# Patient Record
Sex: Male | Born: 1935 | Race: White | Hispanic: No | Marital: Married | Smoking: Former smoker
Health system: Southern US, Community
[De-identification: ages and names within clinical notes are randomized; demographics above are authoritative.]

## PROBLEM LIST (undated history)

## (undated) DIAGNOSIS — I4892 Unspecified atrial flutter: Secondary | ICD-10-CM

## (undated) DIAGNOSIS — I10 Essential (primary) hypertension: Secondary | ICD-10-CM

## (undated) DIAGNOSIS — J449 Chronic obstructive pulmonary disease, unspecified: Secondary | ICD-10-CM

## (undated) HISTORY — PX: KIDNEY STONE SURGERY: SHX686

## (undated) HISTORY — DX: Chronic obstructive pulmonary disease, unspecified: J44.9

## (undated) HISTORY — PX: HERNIA REPAIR: SHX51

## (undated) HISTORY — DX: Essential (primary) hypertension: I10

## (undated) HISTORY — DX: Unspecified atrial flutter: I48.92

---

## 2005-09-29 ENCOUNTER — Ambulatory Visit: Payer: Self-pay | Admitting: Family Medicine

## 2008-09-19 ENCOUNTER — Ambulatory Visit: Payer: Self-pay | Admitting: Family Medicine

## 2008-12-25 ENCOUNTER — Inpatient Hospital Stay: Payer: Self-pay | Admitting: Internal Medicine

## 2009-01-23 ENCOUNTER — Ambulatory Visit: Payer: Self-pay | Admitting: Family Medicine

## 2009-10-02 ENCOUNTER — Ambulatory Visit: Payer: Self-pay | Admitting: Ophthalmology

## 2010-03-31 ENCOUNTER — Ambulatory Visit: Payer: Self-pay | Admitting: Ophthalmology

## 2010-04-07 ENCOUNTER — Ambulatory Visit: Payer: Self-pay | Admitting: Ophthalmology

## 2010-05-15 ENCOUNTER — Ambulatory Visit: Payer: Self-pay | Admitting: Ophthalmology

## 2010-05-21 ENCOUNTER — Ambulatory Visit: Payer: Self-pay | Admitting: Ophthalmology

## 2010-08-04 ENCOUNTER — Emergency Department: Payer: Self-pay | Admitting: Internal Medicine

## 2011-04-03 ENCOUNTER — Inpatient Hospital Stay: Payer: Self-pay | Admitting: Internal Medicine

## 2011-04-14 ENCOUNTER — Observation Stay: Payer: Self-pay | Admitting: Internal Medicine

## 2011-04-14 LAB — COMPREHENSIVE METABOLIC PANEL
Alkaline Phosphatase: 58 U/L (ref 50–136)
Anion Gap: 10 (ref 7–16)
Bilirubin,Total: 0.7 mg/dL (ref 0.2–1.0)
Calcium, Total: 8.6 mg/dL (ref 8.5–10.1)
Chloride: 96 mmol/L — ABNORMAL LOW (ref 98–107)
Co2: 35 mmol/L — ABNORMAL HIGH (ref 21–32)
Creatinine: 0.71 mg/dL (ref 0.60–1.30)
EGFR (African American): >60
EGFR (Non-African Amer.): >60
Glucose: 141 mg/dL — ABNORMAL HIGH (ref 65–99)
Osmolality: 286 (ref 275–301)
SGOT(AST): 19 U/L (ref 15–37)
SGPT (ALT): 21 U/L

## 2011-04-14 LAB — CK TOTAL AND CKMB (NOT AT ARMC)
CK, Total: 34 U/L — ABNORMAL LOW (ref 35–232)
CK-MB: 2.5 ng/mL (ref 0.5–3.6)

## 2011-04-14 LAB — URINALYSIS, COMPLETE
Glucose,UR: NEGATIVE mg/dL (ref 0–75)
Nitrite: NEGATIVE
Ph: 7 (ref 4.5–8.0)
Protein: NEGATIVE
Specific Gravity: 1.004 (ref 1.003–1.030)

## 2011-04-14 LAB — CBC
HGB: 14 g/dL (ref 13.0–18.0)
MCH: 31 pg (ref 26.0–34.0)
MCHC: 32.8 g/dL (ref 32.0–36.0)
MCV: 94 fL (ref 80–100)
RBC: 4.52 10*6/uL (ref 4.40–5.90)
RDW: 14.6 % — ABNORMAL HIGH (ref 11.5–14.5)

## 2011-04-14 LAB — APTT: Activated PTT: 24.9 secs (ref 23.6–35.9)

## 2011-04-14 LAB — TROPONIN I
Troponin-I: 0.03 ng/mL
Troponin-I: 0.04 ng/mL

## 2011-04-15 LAB — CBC WITH DIFFERENTIAL/PLATELET
Basophil %: 0.2 %
Eosinophil %: 0.9 %
HGB: 12.9 g/dL — ABNORMAL LOW (ref 13.0–18.0)
Lymphocyte %: 12.1 %
MCH: 30.9 pg (ref 26.0–34.0)
Monocyte %: 6.2 %
Neutrophil %: 80.6 %
Platelet: 149 10*3/uL — ABNORMAL LOW (ref 150–440)
RBC: 4.17 10*6/uL — ABNORMAL LOW (ref 4.40–5.90)

## 2011-04-15 LAB — BASIC METABOLIC PANEL
Anion Gap: 8 (ref 7–16)
BUN: 21 mg/dL — ABNORMAL HIGH (ref 7–18)
Calcium, Total: 8.4 mg/dL — ABNORMAL LOW (ref 8.5–10.1)
EGFR (African American): >60
Glucose: 113 mg/dL — ABNORMAL HIGH (ref 65–99)
Potassium: 4 mmol/L (ref 3.5–5.1)

## 2011-04-15 LAB — CK-MB: CK-MB: 1.7 ng/mL (ref 0.5–3.6)

## 2012-01-10 ENCOUNTER — Ambulatory Visit: Payer: Self-pay | Admitting: Family Medicine

## 2012-03-13 ENCOUNTER — Institutional Professional Consult (permissible substitution): Payer: Medicare Other | Admitting: Pulmonary Disease

## 2012-03-14 ENCOUNTER — Ambulatory Visit (INDEPENDENT_AMBULATORY_CARE_PROVIDER_SITE_OTHER): Payer: Medicare Other | Admitting: Pulmonary Disease

## 2012-03-14 ENCOUNTER — Encounter: Payer: Self-pay | Admitting: Pulmonary Disease

## 2012-03-14 VITALS — BP 110/58 | HR 72 | Temp 97.6°F | Ht 68.0 in | Wt 125.0 lb

## 2012-03-14 DIAGNOSIS — J449 Chronic obstructive pulmonary disease, unspecified: Secondary | ICD-10-CM | POA: Insufficient documentation

## 2012-03-14 NOTE — Patient Instructions (Signed)
Keep taking your Spiriva and Advair as you are doing Exercise regularly, if you are able to make it to pulmonary rehab, let us know Keep using your oxygen as ordered We will see you back in 4 months or sooner if needed

## 2012-03-14 NOTE — Assessment & Plan Note (Signed)
COPD: GOLD Stage cannot quantify because spirometry not reproducible or acceptable; clear obstruction on flow volume loop noted  Combined recommendations from the [employer]	[place], [employer], Designer, television/film set, European Respiratory Society (Qaseem A et al, Ann Intern Med. 2011;155(3):179) recommends tobacco cessation, pulmonary rehab (for symptomatic patients with an FEV1 < 50% predicted), supplemental oxygen (for patients with SaO2 <88% or paO2 <55), and appropriate bronchodilator therapy.  In regards to long acting bronchodilators, they recommend monotherapy (FEV1 60-80% with symptoms weak evidence, FEV1 with symptoms <60% strong evidence), or combination therapy (FEV1 <60% with symptoms, strong recommendation, moderate evidence).  One should also provide patients with annual immunizations and consider therapy for prevention of COPD exacerbations (ie. roflumilast or azithromycin) when appopriate.  -O2 therapy: 3 L O2 continuously -Immunizations: UTD 2013 -Tobacco use: Quit 2005 -Exercise: Encouraged to exercise around house more, encouraged him to consider pulmonary rehab but he does not have transportation for it -Bronchodilator therapy: continue spiriva and advair -Exacerbation prevention: spiriva

## 2012-03-14 NOTE — Progress Notes (Signed)
Subjective:    Patient ID: Jeffery Lambert, male    DOB: 1921-10-06, 78 y.o.   MRN: [id number]  HPI  This is a very pleasant 78 year old male a COPD who comes to our clinic today to establish care for the same. His primary care physician is Dr. Venora Maples here in [place]. He smoked 4-6 cigarettes a day for 70 years and quit in 2010.  He has noted these have COPD for at least 2-3 years and has been. With Spiriva and Advair for at least that duration of time. He has previously seen by Dr. Meredeth Ide.  He currently is doing well. He lives a fairly sedentary life and does not exert himself on a regular basis. He states that at baseline he walks around the house some and never really experiences shortness of breath. His brother accompanies him today and says that he doesn't think that he can walk through grocery store without stopping because of his lung disease. Admittedly it is difficult to understand Jeffery Lambert given his expressive aphasia.  He does state that he has a chronic cough on a daily basis productive of thick white sputum. He feels that the Spiriva and Advair has helped his symptoms over the years. He has been using 3 L of oxygen continuously for 2 or 3 years.   Past Medical History  Diagnosis Date  . COPD (chronic obstructive pulmonary disease)   . Hypertension   . Atrial flutter      Family History  Problem Relation Age of Onset  . Asthma Brother   . Cancer Father     ? type     History   Social History  . Marital Status: Married    Spouse Name: N/A    Number of Children: 0  . Years of Education: N/A   Occupational History  . Not on file.   Social History Main Topics  . Smoking status: Former Smoker -- 0.3 packs/day for 15 years    Types: Cigarettes    Start date: 04/13/1983  . Smokeless tobacco: Former Neurosurgeon    Types: Chew  . Alcohol Use: No  . Drug Use: No  . Sexually Active: Not on file   Other Topics Concern  . Not on file   Social History  Narrative  . No narrative on file     Allergies  Allergen Reactions  . Codeine Hives and Rash     No outpatient prescriptions prior to visit.    Last reviewed on 03/14/2012 11:00 AM by Christen Butter, CMA     Review of Systems  Constitutional: Positive for appetite change. Negative for fever, chills and activity change.  HENT: Positive for congestion, rhinorrhea, sneezing and postnasal drip. Negative for hearing loss, ear pain, neck pain, neck stiffness and sinus pressure.   Eyes: Negative for redness, itching and visual disturbance.  Respiratory: Positive for cough, shortness of breath and wheezing. Negative for chest tightness.   Cardiovascular: Negative for chest pain, palpitations and leg swelling.  Gastrointestinal: Negative for nausea, vomiting, abdominal pain, diarrhea, constipation, blood in stool and abdominal distention.  Musculoskeletal: Negative for myalgias, joint swelling, arthralgias and gait problem.  Skin: Negative for rash.  Neurological: Positive for dizziness. Negative for light-headedness, numbness and headaches.  Hematological: Does not bruise/bleed easily.  Psychiatric/Behavioral: Negative for confusion and dysphoric mood.       Objective:   Physical Exam  Filed Vitals:   03/14/12 1100  BP: 110/58  Pulse: 72  Temp: 97.6 F (36.4  C)  TempSrc: Oral  Height: 5\' 8"  (1.727 m)  Weight: 125 lb (56.7 kg)  SpO2: 97%    Gen: chronically ill appearing, no acute distress HEENT: NCAT, PERRL, EOMi, OP clear, neck supple without masses PULM: scant wheezing bilaterally CV: RRR, no mgr, no JVD AB: BS+, soft, nontender, no hsm Ext: warm, no edema, no clubbing, no cyanosis Derm: no rash or skin breakdown Neuro: A&O, answers questions with expressive aphasia, maew     Assessment & Plan:   COPD (chronic obstructive pulmonary disease) COPD: GOLD Stage cannot quantify because spirometry not reproducible or acceptable; clear obstruction on flow volume loop  noted  Combined recommendations from the [employer], [employer] of [employer], Designer, television/film set, European Respiratory Society (Qaseem A et al, Ann Intern Med. 2011;155(3):179) recommends tobacco cessation, pulmonary rehab (for symptomatic patients with an FEV1 < 50% predicted), supplemental oxygen (for patients with SaO2 <88% or paO2 <55), and appropriate bronchodilator therapy.  In regards to long acting bronchodilators, they recommend monotherapy (FEV1 60-80% with symptoms weak evidence, FEV1 with symptoms <60% strong evidence), or combination therapy (FEV1 <60% with symptoms, strong recommendation, moderate evidence).  One should also provide patients with annual immunizations and consider therapy for prevention of COPD exacerbations (ie. roflumilast or azithromycin) when appopriate.  -O2 therapy: 3 L O2 continuously -Immunizations: UTD 2013 -Tobacco use: Quit 2005 -Exercise: Encouraged to exercise around house more, encouraged him to consider pulmonary rehab but he does not have transportation for it -Bronchodilator therapy: continue spiriva and advair -Exacerbation prevention: spiriva   Updated Medication List Outpatient Encounter Prescriptions as of 03/14/2012  Medication Sig Dispense Refill  . albuterol (PROVENTIL) (2.5 MG/3ML) 0.083% nebulizer solution Take 2.5 mg by nebulization every 4 (four) hours as needed.      Marland Kitchen aspirin 81 MG tablet Take 81 mg by mouth daily.      . digoxin (LANOXIN) 0.125 MG tablet Take 0.125 mg by mouth daily.      . ferrous sulfate 325 (65 FE) MG tablet Take 325 mg by mouth daily with breakfast.      . Fluticasone-Salmeterol (ADVAIR DISKUS) 250-50 MCG/DOSE AEPB Inhale 1 puff into the lungs every 12 (twelve) hours.      . furosemide (LASIX) 20 MG tablet Take 20 mg by mouth every 3 (three) days.      . metoprolol tartrate (LOPRESSOR) 25 MG tablet Take 25 mg by mouth every other day.      . Multiple Vitamins-Minerals (EYE  VITAMINS PO) Take 2 tablets by mouth daily.      Marland Kitchen tiotropium (SPIRIVA) 18 MCG inhalation capsule Place 18 mcg into inhaler and inhale daily.

## 2013-08-29 ENCOUNTER — Emergency Department: Payer: Self-pay | Admitting: Internal Medicine

## 2013-08-29 LAB — BASIC METABOLIC PANEL
Anion Gap: 9 (ref 7–16)
BUN: 24 mg/dL — ABNORMAL HIGH (ref 7–18)
CALCIUM: 8.9 mg/dL (ref 8.5–10.1)
CO2: 33 mmol/L — AB (ref 21–32)
CREATININE: 0.68 mg/dL (ref 0.60–1.30)
Chloride: 97 mmol/L — ABNORMAL LOW (ref 98–107)
EGFR (African American): >60
EGFR (Non-African Amer.): >60
Glucose: 145 mg/dL — ABNORMAL HIGH (ref 65–99)
OSMOLALITY: 284 (ref 275–301)
Potassium: 4 mmol/L (ref 3.5–5.1)
Sodium: 139 mmol/L (ref 136–145)

## 2013-08-29 LAB — CBC
HCT: 38.7 % — AB (ref 40.0–52.0)
HGB: 12.4 g/dL — AB (ref 13.0–18.0)
MCH: 30 pg (ref 26.0–34.0)
MCHC: 32.1 g/dL (ref 32.0–36.0)
MCV: 94 fL (ref 80–100)
Platelet: 174 10*3/uL (ref 150–440)
RBC: 4.14 10*6/uL — AB (ref 4.40–5.90)
RDW: 13.4 % (ref 11.5–14.5)
WBC: 7.2 10*3/uL (ref 3.8–10.6)

## 2013-08-29 LAB — TROPONIN I: Troponin-I: <0.02 ng/mL

## 2013-09-04 ENCOUNTER — Inpatient Hospital Stay: Payer: Self-pay | Admitting: Student

## 2013-09-04 LAB — PROTIME-INR
INR: 1
Prothrombin Time: 13.2 secs (ref 11.5–14.7)

## 2013-09-04 LAB — CBC
HCT: 38.5 % — AB (ref 40.0–52.0)
HGB: 12.3 g/dL — ABNORMAL LOW (ref 13.0–18.0)
MCH: 30.2 pg (ref 26.0–34.0)
MCHC: 31.9 g/dL — AB (ref 32.0–36.0)
MCV: 95 fL (ref 80–100)
PLATELETS: 151 10*3/uL (ref 150–440)
RBC: 4.07 10*6/uL — ABNORMAL LOW (ref 4.40–5.90)
RDW: 13.3 % (ref 11.5–14.5)
WBC: 8.3 10*3/uL (ref 3.8–10.6)

## 2013-09-04 LAB — URINALYSIS, COMPLETE
Bacteria: NONE SEEN
Bilirubin,UR: NEGATIVE
Glucose,UR: NEGATIVE mg/dL (ref 0–75)
NITRITE: NEGATIVE
Ph: 7 (ref 4.5–8.0)
Protein: NEGATIVE
SQUAMOUS EPITHELIAL: NONE SEEN
Specific Gravity: 1.014 (ref 1.003–1.030)

## 2013-09-04 LAB — MAGNESIUM: Magnesium: 2.2 mg/dL

## 2013-09-04 LAB — COMPREHENSIVE METABOLIC PANEL
ALK PHOS: 75 U/L
ANION GAP: 3 — AB (ref 7–16)
AST: 25 U/L (ref 15–37)
Albumin: 3.2 g/dL — ABNORMAL LOW (ref 3.4–5.0)
BUN: 28 mg/dL — AB (ref 7–18)
Bilirubin,Total: 0.5 mg/dL (ref 0.2–1.0)
CHLORIDE: 101 mmol/L (ref 98–107)
Calcium, Total: 8.7 mg/dL (ref 8.5–10.1)
Co2: 35 mmol/L — ABNORMAL HIGH (ref 21–32)
Creatinine: 0.77 mg/dL (ref 0.60–1.30)
Glucose: 106 mg/dL — ABNORMAL HIGH (ref 65–99)
Osmolality: 283 (ref 275–301)
Potassium: 3.7 mmol/L (ref 3.5–5.1)
SGPT (ALT): 15 U/L (ref 12–78)
Sodium: 139 mmol/L (ref 136–145)
TOTAL PROTEIN: 6.9 g/dL (ref 6.4–8.2)

## 2013-09-04 LAB — APTT
ACTIVATED PTT: 46.6 s — AB (ref 23.6–35.9)
Activated PTT: 25.4 secs (ref 23.6–35.9)

## 2013-09-04 LAB — TROPONIN I
Troponin-I: 0.02 ng/mL
Troponin-I: 0.02 ng/mL
Troponin-I: 0.03 ng/mL

## 2013-09-04 LAB — CK TOTAL AND CKMB (NOT AT ARMC)
CK, TOTAL: 25 U/L — AB
CK, TOTAL: 26 U/L — AB
CK, Total: 32 U/L — ABNORMAL LOW
CK-MB: 1.9 ng/mL (ref 0.5–3.6)
CK-MB: 1.8 ng/mL (ref 0.5–3.6)
CK-MB: 2 ng/mL (ref 0.5–3.6)

## 2013-09-04 LAB — DIGOXIN LEVEL: Digoxin: 1.03 ng/mL

## 2013-09-05 LAB — BASIC METABOLIC PANEL
Anion Gap: 5 — ABNORMAL LOW (ref 7–16)
BUN: 23 mg/dL — ABNORMAL HIGH (ref 7–18)
CALCIUM: 8.3 mg/dL — AB (ref 8.5–10.1)
CREATININE: 0.52 mg/dL — AB (ref 0.60–1.30)
Chloride: 102 mmol/L (ref 98–107)
Co2: 33 mmol/L — ABNORMAL HIGH (ref 21–32)
EGFR (African American): >60
EGFR (Non-African Amer.): >60
GLUCOSE: 109 mg/dL — AB (ref 65–99)
Osmolality: 284 (ref 275–301)
POTASSIUM: 3.4 mmol/L — AB (ref 3.5–5.1)
Sodium: 140 mmol/L (ref 136–145)

## 2013-09-05 LAB — CBC WITH DIFFERENTIAL/PLATELET
BASOS ABS: 0 10*3/uL (ref 0.0–0.1)
Basophil %: 0.6 %
EOS ABS: 0.9 10*3/uL — AB (ref 0.0–0.7)
Eosinophil %: 12.6 %
HCT: 35.1 % — ABNORMAL LOW (ref 40.0–52.0)
HGB: 11.4 g/dL — AB (ref 13.0–18.0)
LYMPHS ABS: 1 10*3/uL (ref 1.0–3.6)
Lymphocyte %: 13.6 %
MCH: 30.6 pg (ref 26.0–34.0)
MCHC: 32.5 g/dL (ref 32.0–36.0)
MCV: 94 fL (ref 80–100)
MONO ABS: 0.7 x10 3/mm (ref 0.2–1.0)
MONOS PCT: 10 %
NEUTROS ABS: 4.5 10*3/uL (ref 1.4–6.5)
Neutrophil %: 63.2 %
Platelet: 135 10*3/uL — ABNORMAL LOW (ref 150–440)
RBC: 3.74 10*6/uL — AB (ref 4.40–5.90)
RDW: 13.3 % (ref 11.5–14.5)
WBC: 7.1 10*3/uL (ref 3.8–10.6)

## 2013-09-05 LAB — LIPID PANEL
CHOLESTEROL: 111 mg/dL (ref 0–200)
HDL Cholesterol: 63 mg/dL — ABNORMAL HIGH (ref 40–60)
Ldl Cholesterol, Calc: 39 mg/dL (ref 0–100)
TRIGLYCERIDES: 47 mg/dL (ref 0–200)
VLDL Cholesterol, Calc: 9 mg/dL (ref 5–40)

## 2013-09-05 LAB — APTT: ACTIVATED PTT: 53.3 s — AB (ref 23.6–35.9)

## 2013-09-05 LAB — MAGNESIUM: Magnesium: 2 mg/dL

## 2013-09-05 LAB — TSH: THYROID STIMULATING HORM: 5.05 u[IU]/mL — AB

## 2013-09-07 LAB — BASIC METABOLIC PANEL
Anion Gap: 3 — ABNORMAL LOW (ref 7–16)
BUN: 21 mg/dL — ABNORMAL HIGH (ref 7–18)
CALCIUM: 8.5 mg/dL (ref 8.5–10.1)
Chloride: 101 mmol/L (ref 98–107)
Co2: 34 mmol/L — ABNORMAL HIGH (ref 21–32)
Creatinine: 0.55 mg/dL — ABNORMAL LOW (ref 0.60–1.30)
EGFR (Non-African Amer.): >60
Glucose: 96 mg/dL (ref 65–99)
Osmolality: 279 (ref 275–301)
Potassium: 3.7 mmol/L (ref 3.5–5.1)
SODIUM: 138 mmol/L (ref 136–145)

## 2013-12-11 ENCOUNTER — Ambulatory Visit: Payer: Self-pay | Admitting: Internal Medicine

## 2014-01-03 ENCOUNTER — Inpatient Hospital Stay: Payer: Self-pay | Admitting: Internal Medicine

## 2014-01-03 LAB — COMPREHENSIVE METABOLIC PANEL
ALBUMIN: 3.4 g/dL (ref 3.4–5.0)
ALK PHOS: 72 U/L
ALT: 27 U/L
AST: 25 U/L (ref 15–37)
Anion Gap: 6 — ABNORMAL LOW (ref 7–16)
BILIRUBIN TOTAL: 0.2 mg/dL (ref 0.2–1.0)
BUN: 44 mg/dL — AB (ref 7–18)
CREATININE: 2 mg/dL — AB (ref 0.60–1.30)
Calcium, Total: 8.3 mg/dL — ABNORMAL LOW (ref 8.5–10.1)
Chloride: 95 mmol/L — ABNORMAL LOW (ref 98–107)
Co2: 37 mmol/L — ABNORMAL HIGH (ref 21–32)
EGFR (African American): 41 — ABNORMAL LOW
GFR CALC NON AF AMER: 34 — AB
Glucose: 108 mg/dL — ABNORMAL HIGH (ref 65–99)
Osmolality: 287 (ref 275–301)
POTASSIUM: 5.1 mmol/L (ref 3.5–5.1)
Sodium: 138 mmol/L (ref 136–145)
Total Protein: 7.8 g/dL (ref 6.4–8.2)

## 2014-01-03 LAB — CBC WITH DIFFERENTIAL/PLATELET
BASOS PCT: 0.9 %
Basophil #: 0.1 10*3/uL (ref 0.0–0.1)
Eosinophil #: 0.1 10*3/uL (ref 0.0–0.7)
Eosinophil %: 2.1 %
HCT: 28.6 % — AB (ref 40.0–52.0)
HGB: 9.5 g/dL — AB (ref 13.0–18.0)
Lymphocyte #: 0.8 10*3/uL — ABNORMAL LOW (ref 1.0–3.6)
Lymphocyte %: 11.8 %
MCH: 32.3 pg (ref 26.0–34.0)
MCHC: 33.2 g/dL (ref 32.0–36.0)
MCV: 97 fL (ref 80–100)
MONO ABS: 0.8 x10 3/mm (ref 0.2–1.0)
Monocyte %: 11.6 %
NEUTROS ABS: 4.8 10*3/uL (ref 1.4–6.5)
Neutrophil %: 73.6 %
PLATELETS: 133 10*3/uL — AB (ref 150–440)
RBC: 2.94 10*6/uL — AB (ref 4.40–5.90)
RDW: 16.8 % — ABNORMAL HIGH (ref 11.5–14.5)
WBC: 6.5 10*3/uL (ref 3.8–10.6)

## 2014-01-03 LAB — CK TOTAL AND CKMB (NOT AT ARMC)
CK, TOTAL: 67 U/L
CK, Total: 69 U/L
CK-MB: 3.2 ng/mL (ref 0.5–3.6)
CK-MB: 2.5 ng/mL (ref 0.5–3.6)

## 2014-01-03 LAB — POTASSIUM: POTASSIUM: 4 mmol/L (ref 3.5–5.1)

## 2014-01-03 LAB — URINALYSIS, COMPLETE
BILIRUBIN, UR: NEGATIVE
Bacteria: NONE SEEN
GLUCOSE, UR: NEGATIVE mg/dL (ref 0–75)
Ketone: NEGATIVE
Nitrite: NEGATIVE
Ph: 5 (ref 4.5–8.0)
Protein: NEGATIVE
RBC,UR: 203 /HPF (ref 0–5)
Specific Gravity: 1.01 (ref 1.003–1.030)
Squamous Epithelial: <1
WBC UR: 21 /HPF (ref 0–5)

## 2014-01-03 LAB — DIGOXIN LEVEL: DIGOXIN: 4.5 ng/mL — AB

## 2014-01-03 LAB — TROPONIN I
Troponin-I: <0.02 ng/mL
Troponin-I: <0.02 ng/mL

## 2014-01-03 LAB — TSH

## 2014-01-04 LAB — T4, FREE: FREE THYROXINE: 0.23 ng/dL — AB (ref 0.76–1.46)

## 2014-01-04 LAB — BASIC METABOLIC PANEL
ANION GAP: 6 — AB (ref 7–16)
BUN: 45 mg/dL — ABNORMAL HIGH (ref 7–18)
Calcium, Total: 8.2 mg/dL — ABNORMAL LOW (ref 8.5–10.1)
Chloride: 100 mmol/L (ref 98–107)
Co2: 33 mmol/L — ABNORMAL HIGH (ref 21–32)
Creatinine: 1.83 mg/dL — ABNORMAL HIGH (ref 0.60–1.30)
EGFR (African American): 45 — ABNORMAL LOW
EGFR (Non-African Amer.): 37 — ABNORMAL LOW
Glucose: 70 mg/dL (ref 65–99)
OSMOLALITY: 288 (ref 275–301)
Potassium: 4.1 mmol/L (ref 3.5–5.1)
Sodium: 139 mmol/L (ref 136–145)

## 2014-01-04 LAB — CK TOTAL AND CKMB (NOT AT ARMC)
CK, Total: 56 U/L
CK-MB: 2 ng/mL (ref 0.5–3.6)

## 2014-01-04 LAB — TROPONIN I
TROPONIN-I: 0.06 ng/mL — AB
Troponin-I: 0.04 ng/mL

## 2014-01-04 LAB — DIGOXIN LEVEL: Digoxin: 0.24 ng/mL

## 2014-01-05 LAB — HEMOGLOBIN: HGB: 8.1 g/dL — ABNORMAL LOW (ref 13.0–18.0)

## 2014-01-05 LAB — OCCULT BLOOD X 1 CARD TO LAB, STOOL: OCCULT BLOOD, FECES: POSITIVE

## 2014-01-06 LAB — CBC WITH DIFFERENTIAL/PLATELET
BASOS PCT: 1.2 %
Basophil #: 0.1 10*3/uL (ref 0.0–0.1)
Eosinophil #: 0.4 10*3/uL (ref 0.0–0.7)
Eosinophil %: 7 %
HCT: 26.4 % — ABNORMAL LOW (ref 40.0–52.0)
HGB: 8.6 g/dL — AB (ref 13.0–18.0)
Lymphocyte #: 0.7 10*3/uL — ABNORMAL LOW (ref 1.0–3.6)
Lymphocyte %: 13.9 %
MCH: 31.9 pg (ref 26.0–34.0)
MCHC: 32.7 g/dL (ref 32.0–36.0)
MCV: 98 fL (ref 80–100)
MONOS PCT: 8.5 %
Monocyte #: 0.4 x10 3/mm (ref 0.2–1.0)
Neutrophil #: 3.6 10*3/uL (ref 1.4–6.5)
Neutrophil %: 69.4 %
Platelet: 103 10*3/uL — ABNORMAL LOW (ref 150–440)
RBC: 2.71 10*6/uL — AB (ref 4.40–5.90)
RDW: 16.1 % — AB (ref 11.5–14.5)
WBC: 5.2 10*3/uL (ref 3.8–10.6)

## 2014-01-06 LAB — BASIC METABOLIC PANEL
Anion Gap: 3 — ABNORMAL LOW (ref 7–16)
BUN: 19 mg/dL — ABNORMAL HIGH (ref 7–18)
CO2: 33 mmol/L — AB (ref 21–32)
Calcium, Total: 7.5 mg/dL — ABNORMAL LOW (ref 8.5–10.1)
Chloride: 105 mmol/L (ref 98–107)
Creatinine: 1.01 mg/dL (ref 0.60–1.30)
EGFR (African American): >60
EGFR (Non-African Amer.): >60
GLUCOSE: 75 mg/dL (ref 65–99)
Osmolality: 282 (ref 275–301)
POTASSIUM: 3.8 mmol/L (ref 3.5–5.1)
Sodium: 141 mmol/L (ref 136–145)

## 2014-01-10 ENCOUNTER — Ambulatory Visit: Payer: Self-pay | Admitting: Internal Medicine

## 2014-04-12 ENCOUNTER — Ambulatory Visit: Payer: Self-pay | Admitting: Internal Medicine

## 2014-04-12 DIAGNOSIS — J449 Chronic obstructive pulmonary disease, unspecified: Secondary | ICD-10-CM | POA: Diagnosis not present

## 2014-04-13 DIAGNOSIS — I509 Heart failure, unspecified: Secondary | ICD-10-CM

## 2014-04-13 DIAGNOSIS — J9600 Acute respiratory failure, unspecified whether with hypoxia or hypercapnia: Secondary | ICD-10-CM

## 2014-04-13 DIAGNOSIS — I4892 Unspecified atrial flutter: Secondary | ICD-10-CM | POA: Diagnosis not present

## 2014-04-14 ENCOUNTER — Inpatient Hospital Stay: Payer: Self-pay | Admitting: Internal Medicine

## 2014-04-14 DIAGNOSIS — D638 Anemia in other chronic diseases classified elsewhere: Secondary | ICD-10-CM | POA: Diagnosis not present

## 2014-04-14 DIAGNOSIS — N179 Acute kidney failure, unspecified: Secondary | ICD-10-CM | POA: Diagnosis not present

## 2014-04-14 DIAGNOSIS — T460X5A Adverse effect of cardiac-stimulant glycosides and drugs of similar action, initial encounter: Secondary | ICD-10-CM | POA: Diagnosis not present

## 2014-04-14 DIAGNOSIS — I479 Paroxysmal tachycardia, unspecified: Secondary | ICD-10-CM | POA: Diagnosis not present

## 2014-04-14 DIAGNOSIS — R531 Weakness: Secondary | ICD-10-CM | POA: Diagnosis not present

## 2014-04-14 DIAGNOSIS — I509 Heart failure, unspecified: Secondary | ICD-10-CM | POA: Diagnosis not present

## 2014-04-14 DIAGNOSIS — J9621 Acute and chronic respiratory failure with hypoxia: Secondary | ICD-10-CM | POA: Diagnosis not present

## 2014-04-14 DIAGNOSIS — R05 Cough: Secondary | ICD-10-CM | POA: Diagnosis not present

## 2014-04-14 DIAGNOSIS — R001 Bradycardia, unspecified: Secondary | ICD-10-CM | POA: Diagnosis not present

## 2014-04-14 DIAGNOSIS — J441 Chronic obstructive pulmonary disease with (acute) exacerbation: Secondary | ICD-10-CM | POA: Diagnosis not present

## 2014-04-14 DIAGNOSIS — Z681 Body mass index (BMI) 19 or less, adult: Secondary | ICD-10-CM | POA: Diagnosis not present

## 2014-04-14 DIAGNOSIS — E785 Hyperlipidemia, unspecified: Secondary | ICD-10-CM | POA: Diagnosis not present

## 2014-04-14 DIAGNOSIS — R000 Tachycardia, unspecified: Secondary | ICD-10-CM | POA: Diagnosis not present

## 2014-04-14 DIAGNOSIS — E039 Hypothyroidism, unspecified: Secondary | ICD-10-CM | POA: Diagnosis not present

## 2014-04-14 DIAGNOSIS — E876 Hypokalemia: Secondary | ICD-10-CM | POA: Diagnosis not present

## 2014-04-14 DIAGNOSIS — S0101XA Laceration without foreign body of scalp, initial encounter: Secondary | ICD-10-CM | POA: Diagnosis not present

## 2014-04-14 DIAGNOSIS — I4891 Unspecified atrial fibrillation: Secondary | ICD-10-CM | POA: Diagnosis not present

## 2014-04-14 DIAGNOSIS — I10 Essential (primary) hypertension: Secondary | ICD-10-CM | POA: Diagnosis not present

## 2014-04-14 DIAGNOSIS — R0602 Shortness of breath: Secondary | ICD-10-CM | POA: Diagnosis not present

## 2014-04-14 DIAGNOSIS — Z66 Do not resuscitate: Secondary | ICD-10-CM | POA: Diagnosis not present

## 2014-04-14 DIAGNOSIS — E43 Unspecified severe protein-calorie malnutrition: Secondary | ICD-10-CM | POA: Diagnosis not present

## 2014-04-14 DIAGNOSIS — J439 Emphysema, unspecified: Secondary | ICD-10-CM | POA: Diagnosis not present

## 2014-04-14 DIAGNOSIS — W19XXXA Unspecified fall, initial encounter: Secondary | ICD-10-CM | POA: Diagnosis not present

## 2014-04-14 DIAGNOSIS — J984 Other disorders of lung: Secondary | ICD-10-CM | POA: Diagnosis not present

## 2014-04-14 DIAGNOSIS — Z515 Encounter for palliative care: Secondary | ICD-10-CM | POA: Diagnosis not present

## 2014-04-14 DIAGNOSIS — I4892 Unspecified atrial flutter: Secondary | ICD-10-CM | POA: Diagnosis not present

## 2014-04-14 DIAGNOSIS — J440 Chronic obstructive pulmonary disease with acute lower respiratory infection: Secondary | ICD-10-CM | POA: Diagnosis not present

## 2014-04-14 DIAGNOSIS — J9600 Acute respiratory failure, unspecified whether with hypoxia or hypercapnia: Secondary | ICD-10-CM | POA: Diagnosis not present

## 2014-04-14 DIAGNOSIS — Z9981 Dependence on supplemental oxygen: Secondary | ICD-10-CM | POA: Diagnosis not present

## 2014-04-14 DIAGNOSIS — Z87891 Personal history of nicotine dependence: Secondary | ICD-10-CM | POA: Diagnosis not present

## 2014-04-14 DIAGNOSIS — J90 Pleural effusion, not elsewhere classified: Secondary | ICD-10-CM | POA: Diagnosis not present

## 2014-04-14 DIAGNOSIS — I471 Supraventricular tachycardia: Secondary | ICD-10-CM | POA: Diagnosis not present

## 2014-04-14 DIAGNOSIS — S0990XA Unspecified injury of head, initial encounter: Secondary | ICD-10-CM | POA: Diagnosis not present

## 2014-04-14 DIAGNOSIS — R0789 Other chest pain: Secondary | ICD-10-CM | POA: Diagnosis not present

## 2014-04-14 DIAGNOSIS — J189 Pneumonia, unspecified organism: Secondary | ICD-10-CM | POA: Diagnosis not present

## 2014-04-14 DIAGNOSIS — J410 Simple chronic bronchitis: Secondary | ICD-10-CM | POA: Diagnosis not present

## 2014-04-14 DIAGNOSIS — I959 Hypotension, unspecified: Secondary | ICD-10-CM | POA: Diagnosis not present

## 2014-04-14 DIAGNOSIS — R627 Adult failure to thrive: Secondary | ICD-10-CM | POA: Diagnosis not present

## 2014-04-14 DIAGNOSIS — I517 Cardiomegaly: Secondary | ICD-10-CM | POA: Diagnosis not present

## 2014-04-14 DIAGNOSIS — J449 Chronic obstructive pulmonary disease, unspecified: Secondary | ICD-10-CM | POA: Diagnosis not present

## 2014-04-14 LAB — TROPONIN I: Troponin-I: 0.05 ng/mL

## 2014-04-14 LAB — CBC
HCT: 32.7 % — ABNORMAL LOW (ref 40.0–52.0)
HGB: 10.5 g/dL — AB (ref 13.0–18.0)
MCH: 30.3 pg (ref 26.0–34.0)
MCHC: 32.2 g/dL (ref 32.0–36.0)
MCV: 94 fL (ref 80–100)
Platelet: 163 10*3/uL (ref 150–440)
RBC: 3.47 10*6/uL — AB (ref 4.40–5.90)
RDW: 13.6 % (ref 11.5–14.5)
WBC: 7.8 10*3/uL (ref 3.8–10.6)

## 2014-04-14 LAB — URINALYSIS, COMPLETE
Bacteria: NONE SEEN
Bilirubin,UR: NEGATIVE
GLUCOSE, UR: NEGATIVE mg/dL (ref 0–75)
KETONE: NEGATIVE
Nitrite: NEGATIVE
PH: 5 (ref 4.5–8.0)
Protein: NEGATIVE
SQUAMOUS EPITHELIAL: NONE SEEN
Specific Gravity: 1.009 (ref 1.003–1.030)

## 2014-04-14 LAB — BASIC METABOLIC PANEL
Anion Gap: 7 (ref 7–16)
BUN: 25 mg/dL — ABNORMAL HIGH (ref 7–18)
CO2: 31 mmol/L (ref 21–32)
CREATININE: 0.95 mg/dL (ref 0.60–1.30)
Calcium, Total: 8.5 mg/dL (ref 8.5–10.1)
Chloride: 99 mmol/L (ref 98–107)
EGFR (African American): >60
EGFR (Non-African Amer.): >60
Glucose: 121 mg/dL — ABNORMAL HIGH (ref 65–99)
Osmolality: 279 (ref 275–301)
POTASSIUM: 3.6 mmol/L (ref 3.5–5.1)
SODIUM: 137 mmol/L (ref 136–145)

## 2014-04-14 LAB — TSH: Thyroid Stimulating Horm: 2.92 u[IU]/mL

## 2014-04-14 LAB — PRO B NATRIURETIC PEPTIDE: B-Type Natriuretic Peptide: 9786 pg/mL — ABNORMAL HIGH (ref 0–450)

## 2014-04-15 LAB — TROPONIN I
Troponin-I: 0.04 ng/mL
Troponin-I: 0.03 ng/mL

## 2014-04-16 LAB — BASIC METABOLIC PANEL
Anion Gap: 7 (ref 7–16)
BUN: 23 mg/dL — AB (ref 7–18)
CREATININE: 0.93 mg/dL (ref 0.60–1.30)
Calcium, Total: 8.9 mg/dL (ref 8.5–10.1)
Chloride: 103 mmol/L (ref 98–107)
Co2: 31 mmol/L (ref 21–32)
EGFR (African American): >60
Glucose: 128 mg/dL — ABNORMAL HIGH (ref 65–99)
Osmolality: 287 (ref 275–301)
POTASSIUM: 3.6 mmol/L (ref 3.5–5.1)
SODIUM: 141 mmol/L (ref 136–145)

## 2014-04-16 LAB — URINE CULTURE

## 2014-04-16 LAB — MAGNESIUM: MAGNESIUM: 2 mg/dL

## 2014-04-18 LAB — CBC WITH DIFFERENTIAL/PLATELET
BASOS ABS: 0 10*3/uL (ref 0.0–0.1)
Basophil %: 0.2 %
EOS ABS: 0.4 10*3/uL (ref 0.0–0.7)
Eosinophil %: 5 %
HCT: 30.7 % — ABNORMAL LOW (ref 40.0–52.0)
HGB: 9.9 g/dL — ABNORMAL LOW (ref 13.0–18.0)
LYMPHS ABS: 0.8 10*3/uL — AB (ref 1.0–3.6)
Lymphocyte %: 10.1 %
MCH: 30.1 pg (ref 26.0–34.0)
MCHC: 32.3 g/dL (ref 32.0–36.0)
MCV: 93 fL (ref 80–100)
MONO ABS: 0.7 x10 3/mm (ref 0.2–1.0)
Monocyte %: 8.9 %
Neutrophil #: 6.3 10*3/uL (ref 1.4–6.5)
Neutrophil %: 75.8 %
Platelet: 162 10*3/uL (ref 150–440)
RBC: 3.31 10*6/uL — AB (ref 4.40–5.90)
RDW: 13.4 % (ref 11.5–14.5)
WBC: 8.4 10*3/uL (ref 3.8–10.6)

## 2014-04-18 LAB — BASIC METABOLIC PANEL
Anion Gap: 3 — ABNORMAL LOW (ref 7–16)
BUN: 12 mg/dL (ref 7–18)
CO2: 34 mmol/L — AB (ref 21–32)
CREATININE: 0.71 mg/dL (ref 0.60–1.30)
Calcium, Total: 7.4 mg/dL — ABNORMAL LOW (ref 8.5–10.1)
Chloride: 100 mmol/L (ref 98–107)
EGFR (African American): >60
EGFR (Non-African Amer.): >60
Glucose: 120 mg/dL — ABNORMAL HIGH (ref 65–99)
Osmolality: 275 (ref 275–301)
POTASSIUM: 3.3 mmol/L — AB (ref 3.5–5.1)
SODIUM: 137 mmol/L (ref 136–145)

## 2014-04-19 LAB — HEPATIC FUNCTION PANEL A (ARMC)
ALK PHOS: 57 U/L
ALT: 19 U/L
Albumin: 1.7 g/dL — ABNORMAL LOW (ref 3.4–5.0)
Bilirubin, Direct: 0.1 mg/dL (ref 0.0–0.2)
Bilirubin,Total: 0.3 mg/dL (ref 0.2–1.0)
SGOT(AST): 13 U/L — ABNORMAL LOW (ref 15–37)
Total Protein: 5 g/dL — ABNORMAL LOW (ref 6.4–8.2)

## 2014-04-19 LAB — BASIC METABOLIC PANEL
ANION GAP: 6 — AB (ref 7–16)
Anion Gap: 6 — ABNORMAL LOW (ref 7–16)
BUN: 11 mg/dL (ref 7–18)
BUN: 10 mg/dL (ref 7–18)
CALCIUM: 7.8 mg/dL — AB (ref 8.5–10.1)
CO2: 34 mmol/L — AB (ref 21–32)
CREATININE: 0.72 mg/dL (ref 0.60–1.30)
Calcium, Total: 7.7 mg/dL — ABNORMAL LOW (ref 8.5–10.1)
Chloride: 100 mmol/L (ref 98–107)
Chloride: 101 mmol/L (ref 98–107)
Co2: 33 mmol/L — ABNORMAL HIGH (ref 21–32)
Creatinine: 0.67 mg/dL (ref 0.60–1.30)
EGFR (African American): >60
EGFR (Non-African Amer.): >60
EGFR (Non-African Amer.): >60
Glucose: 114 mg/dL — ABNORMAL HIGH (ref 65–99)
Glucose: 121 mg/dL — ABNORMAL HIGH (ref 65–99)
Osmolality: 280 (ref 275–301)
Osmolality: 280 (ref 275–301)
Potassium: 3 mmol/L — ABNORMAL LOW (ref 3.5–5.1)
Potassium: 2.9 mmol/L — ABNORMAL LOW (ref 3.5–5.1)
SODIUM: 140 mmol/L (ref 136–145)
Sodium: 140 mmol/L (ref 136–145)

## 2014-04-19 LAB — POTASSIUM
POTASSIUM: 3.2 mmol/L — AB (ref 3.5–5.1)
POTASSIUM: 4.4 mmol/L (ref 3.5–5.1)

## 2014-04-19 LAB — CULTURE, BLOOD (SINGLE)

## 2014-04-19 LAB — MAGNESIUM: Magnesium: 1.8 mg/dL

## 2014-04-20 LAB — BASIC METABOLIC PANEL
ANION GAP: 2 — AB (ref 7–16)
BUN: 10 mg/dL (ref 7–18)
Calcium, Total: 7.9 mg/dL — ABNORMAL LOW (ref 8.5–10.1)
Chloride: 106 mmol/L (ref 98–107)
Co2: 32 mmol/L (ref 21–32)
Creatinine: 0.6 mg/dL (ref 0.60–1.30)
EGFR (Non-African Amer.): >60
Glucose: 103 mg/dL — ABNORMAL HIGH (ref 65–99)
Osmolality: 279 (ref 275–301)
Potassium: 3.5 mmol/L (ref 3.5–5.1)
Sodium: 140 mmol/L (ref 136–145)

## 2014-04-20 LAB — CBC WITH DIFFERENTIAL/PLATELET
BASOS PCT: 0.3 %
Basophil #: 0 10*3/uL (ref 0.0–0.1)
Eosinophil #: 0.5 10*3/uL (ref 0.0–0.7)
Eosinophil %: 6 %
HCT: 27.1 % — ABNORMAL LOW (ref 40.0–52.0)
HGB: 8.7 g/dL — ABNORMAL LOW (ref 13.0–18.0)
Lymphocyte #: 0.8 10*3/uL — ABNORMAL LOW (ref 1.0–3.6)
Lymphocyte %: 10.4 %
MCH: 29.6 pg (ref 26.0–34.0)
MCHC: 32 g/dL (ref 32.0–36.0)
MCV: 93 fL (ref 80–100)
MONOS PCT: 6.7 %
Monocyte #: 0.5 x10 3/mm (ref 0.2–1.0)
Neutrophil #: 5.8 10*3/uL (ref 1.4–6.5)
Neutrophil %: 76.6 %
PLATELETS: 182 10*3/uL (ref 150–440)
RBC: 2.93 10*6/uL — ABNORMAL LOW (ref 4.40–5.90)
RDW: 13.4 % (ref 11.5–14.5)
WBC: 7.5 10*3/uL (ref 3.8–10.6)

## 2014-04-20 LAB — MAGNESIUM: Magnesium: 1.7 mg/dL — ABNORMAL LOW

## 2014-05-13 ENCOUNTER — Ambulatory Visit: Payer: Self-pay | Admitting: Internal Medicine

## 2014-05-13 DEATH — deceased

## 2014-08-03 NOTE — Consult Note (Signed)
PATIENT NAME:  Jeffery Lambert, Jeffery Lambert MR#:  [id number] DATE OF BIRTH:  05-Aug-1921  DATE OF CONSULTATION:  09/04/2013  CONSULTING PHYSICIAN:  Dwayne D. Juliann Paresallwood, MD  PRIMARY CARE PHYSICIAN: Dr. Juanetta GoslingHawkins.   CARDIOLOGIST: Dr. Darrold JunkerParaschos.   REASON FOR CONSULT:  Atrial fibrillation.   HISTORY OF PRESENT ILLNESS: A 81 year old white male with severe chronic obstructive pulmonary disease on chronic home O2, respiratory failure, has a history of atrial flutter. The patient came to the Emergency Room on 05/20 for shortness of breath, discharged on Levaquin and prednisone taper for bronchitis. Chest x-ray at the time did not show pneumonia. The patient had been using albuterol nebulizers, but did not get better. States that the shortness of breath has been going on for several months and got progressively worse. The patient complains of weakness. Had no palpitations or chest pain. He had gone to follow up with his PCP for follow-up but was found to have a heart rate of 170, so he was sent back to the Emergency Room for evaluation, placed on diltiazem to help improve heart rate.   PAST MEDICAL HISTORY: Chronic respiratory failure on 3 liters O2, hypoxemia, chronic obstructive pulmonary disease,  hypertension, anxiety, deficiency anemia, congestive heart failure, atrial flutter,   PAST SURGICAL HISTORY: Bilateral hernia repair, kidney stone, cataract.   SOCIAL HISTORY: Wife is deceased. Lives by himself.  Quit smoking in 1982.   FAMILY HISTORY: Hypertension, cancer, pneumonia.   OUTPATIENT MEDICATIONS: Levaquin 500 daily, Spiriva 18 mcg a day, Advair 250/50  twice a day, albuterol 4 times a day, aspirin 81 mg a day, digoxin 0.125 a day, iron 325 mg a day, Lasix 20 mg 2 times a day, ICaps  2 tablets,  metoprolol 25 twice a day.   REVIEW OF SYSTEMS: Weakness, fatigue, shortness of breath. No black out spells. No nausea or vomiting. No fever, no chills, no sweats. No weight loss, weight gain. Complained of  generalized weakness. History of anxiety, otherwise negative.   PHYSICAL EXAMINATION: VITAL SIGNS: Blood pressure 120/70, pulse rate 140  and irregular, respiratory rate  20, afebrile.  HEENT: Normocephalic, atraumatic. Pupils equal and reactive to light.  NECK: Supple. No significant JVD, bruits or adenopathy. He has a speech impairment.  LUNGS: Clear to auscultation and percussion. Mild rhonchi bilaterally. No wheezing. Mild dullness.  HEART: Irregularly regular. Systolic ejection murmur at the apex. PMI nondisplaced.  ABDOMEN: Benign.  EXTREMITIES: Within normal limits.  NEUROLOGIC: Intact.  SKIN: Normal.   Chest x-ray negative. PT/INR negative. White count 8.3, hemoglobin 12.3, platelet count of 151. troponin negative. Digoxin 1.03, BUN 28, creatinine 0.77, sodium 139, potassium 3.7, ABG pH 7.45, CO2 51,  pO2 50, lactic  acid 0.5.   EKG: Atrial fibrillation, tachycardia   ST changes.   ASSESSMENT: Rapid atrial fibrillation, hypoxemia, shortness of breath, anemia.   PLAN: Agree with admit. Agree with atrial fibrillation therapy, would probably add digoxin and Cardizem. He dropped his blood pressure so we will increase digoxin level. We will consider amiodarone, would consider anticoagulation as possible but he has had GI bleed in the past. Continue home O2 for chronic obstructive pulmonary disease, continue inhalers therapy. Consider prednisone taper. Consider antibiotics, consider pulmonary input. Echocardiogram would be helpful. Short-term anticoagulation may also be helpful. We will treat the patient medically with reduced dose of Cardizem and increased doses of dig and consider amiodarone if this continues. Do not recommend electrical cardioversion at this point.   ____________________________ Bobbie Stackwayne D. Juliann Paresallwood, MD ddc:sg D: 09/05/2013 13:39:35 ET  T: 09/05/2013 13:58:45 ET JOB#: [id number]  cc: Dwayne D. Juliann Pares, MD, <Dictator> Alwyn Pea MD ELECTRONICALLY SIGNED 10/17/2013  16:42

## 2014-08-03 NOTE — Consult Note (Signed)
Chief Complaint:  Subjective/Chief Complaint Palpitations improved. Still sob o cp.   VITAL SIGNS/ANCILLARY NOTES: **Vital Signs.:   27-May-15 08:00  Pulse Pulse 62  Respirations Respirations 27  Systolic BP Systolic BP 333  Diastolic BP (mmHg) Diastolic BP (mmHg) 56  Mean BP 81  Pulse Ox % Pulse Ox % 99  Pulse Ox Activity Level  At rest  Oxygen Delivery 3L  Pulse Ox Heart Rate 62  *Intake and Output.:   Shift 27-May-15 07:00  Grand Totals Intake:  157 Output:  325    Net:  -168 24 Hr.:  -151.2  Heparin      In:  152  Cardizem      In:  5  Urine ml     Out:  325  Length of Stay Totals Intake:  473.8 Output:  625    Net:  -151.2   Brief Assessment:  GEN well developed, well nourished, no acute distress   Cardiac Irregular  murmur present  -- JVD   Respiratory normal resp effort   Gastrointestinal Normal   Gastrointestinal details normal Soft  Nontender  Nondistended   EXTR negative cyanosis/clubbing, negative edema   Lab Results: Hepatic:  26-May-15 11:20   Bilirubin, Total 0.5  Alkaline Phosphatase 75 (45-117 NOTE: New Reference Range 03/02/13)  SGPT (ALT) 15  SGOT (AST) 25  Total Protein, Serum 6.9  Albumin, Serum  3.2  TDMs:  26-May-15 11:20   Digoxin, Serum 1.03 (Therapeutic range for digoxin in patients with atrial fibrillation: 0.8 - 2.0 ng/mL. In patients with congestive heart failure a therapeutic range of 0.5 - 0.8 ng/mL is suggested as higher levels are associated with an increased risk of toxicity without clear evidence of enhanced efficacy. Digoxin toxicity is commonly associated with serum levels > 2.0 ng/mL but may occur with lower levels, including those in the therapeutic range. Blood samples should be obtained 6-8 hours after administration to assure a reasonable volume of distribution.)  Lab:  26-May-15 12:05   pH (ABG)  7.45  PCO2  51  PO2  50  FiO2 21  Base Excess  9.7  HCO3  35.4  O2 Saturation 88.2  Specimen Site (ABG)  RT RADIAL  Specimen Type (ABG) ARTERIAL  Patient Temp (ABG) 37.0 (Result(s) reported on 04 Sep 2013 at 12:29PM.)  Lactic Acid, Cardiopulmonary 0.5 (Result(s) reported on 04 Sep 2013 at 12:29PM.)  Routine Chem:  26-May-15 11:20   Glucose, Serum  106  BUN  28  Creatinine (comp) 0.77  Sodium, Serum 139  Potassium, Serum 3.7  Chloride, Serum 101  CO2, Serum  35  Calcium (Total), Serum 8.7  Anion Gap  3  Osmolality (calc) 283  eGFR (African American) >60  eGFR (Non-African American) >60 (eGFR values <49m/min/1.73 m2 may be an indication of chronic kidney disease (CKD). Calculated eGFR is useful in patients with stable renal function. The eGFR calculation will not be reliable in acutely ill patients when serum creatinine is changing rapidly. It is not useful in  patients on dialysis. The eGFR calculation may not be applicable to patients at the low and high extremes of body sizes, pregnant women, and vegetarians.)  Magnesium, Serum 2.2 (1.8-2.4 THERAPEUTIC RANGE: 4-7 mg/dL TOXIC: > 10 mg/dL  -----------------------)  Cardiac:  26-May-15 11:20   CK, Total  32 (39-308 NOTE: NEW REFERENCE RANGE  05/14/2013)  CPK-MB, Serum 1.9 (Result(s) reported on 04 Sep 2013 at 04:53PM.)  Troponin I 0.02 (0.00-0.05 0.05 ng/mL or less: NEGATIVE  Repeat testing in 3-6  hrs  if clinically indicated. >0.05 ng/mL: POTENTIAL  MYOCARDIAL INJURY. Repeat  testing in 3-6 hrs if  clinically indicated. NOTE: An increase or decrease  of 30% or more on serial  testing suggests a  clinically important change)    15:28   CK, Total  25 (39-308 NOTE: NEW REFERENCE RANGE  05/14/2013)  CPK-MB, Serum 1.8 (Result(s) reported on 04 Sep 2013 at 04:04PM.)  Troponin I 0.02 (0.00-0.05 0.05 ng/mL or less: NEGATIVE  Repeat testing in 3-6 hrs  if clinically indicated. >0.05 ng/mL: POTENTIAL  MYOCARDIAL INJURY. Repeat  testing in 3-6 hrs if  clinically indicated. NOTE: An increase or decrease  of 30% or more  on serial  testing suggests a  clinically important change)    20:07   CK, Total  26 (39-308 NOTE: NEW REFERENCE RANGE  05/14/2013)  CPK-MB, Serum 2.0 (Result(s) reported on 04 Sep 2013 at 08:38PM.)  Troponin I 0.03 (0.00-0.05 0.05 ng/mL or less: NEGATIVE  Repeat testing in 3-6 hrs  if clinically indicated. >0.05 ng/mL: POTENTIAL  MYOCARDIAL INJURY. Repeat  testing in 3-6 hrs if  clinically indicated. NOTE: An increase or decrease  of 30% or more on serial  testing suggests a  clinically important change)  Routine UA:  26-May-15 12:01   Color (UA) Yellow  Clarity (UA) Clear  Glucose (UA) Negative  Bilirubin (UA) Negative  Ketones (UA) Trace  Specific Gravity (UA) 1.014  Blood (UA) 1+  pH (UA) 7.0  Protein (UA) Negative  Nitrite (UA) Negative  Leukocyte Esterase (UA) Trace (Result(s) reported on 04 Sep 2013 at 12:43PM.)  RBC (UA) 14 /HPF  WBC (UA) 9 /HPF  Bacteria (UA) NONE SEEN  Epithelial Cells (UA) NONE SEEN  Mucous (UA) PRESENT (Result(s) reported on 04 Sep 2013 at 12:43PM.)  Routine Coag:  26-May-15 11:20   Activated PTT (APTT) 25.4 (A HCT value >55% may artifactually increase the APTT. In one study, the increase was an average of 19%. Reference: "Effect on Routine and Special Coagulation Testing Values of Citrate Anticoagulant Adjustment in Patients with High HCT Values." American Journal of Clinical Pathology 2006;126:400-405.)  Prothrombin 13.2  INR 1.0 (INR reference interval applies to patients on anticoagulant therapy. A single INR therapeutic range for coumarins is not optimal for all indications; however, the suggested range for most indications is 2.0 - 3.0. Exceptions to the INR Reference Range may include: Prosthetic heart valves, acute myocardial infarction, prevention of myocardial infarction, and combinations of aspirin and anticoagulant. The need for a higher or lower target INR must be assessed individually. Reference: The Pharmacology and  Management of the Vitamin K  antagonists: the seventh ACCP Conference on Antithrombotic and Thrombolytic Therapy. WOEHO.1224 Sept:126 (3suppl): [id number]. A HCT value >55% may artifactually increase the PT.  In one study,  the increase was an average of 25%. Reference:  "Effect on Routine and Special Coagulation Testing Values of Citrate Anticoagulant Adjustment in Patients with High HCT Values." American Journal of Clinical Pathology 8250;037:048-889.)    21:57   Activated PTT (APTT)  46.6 (A HCT value >55% may artifactually increase the APTT. In one study, the increase was an average of 19%. Reference: "Effect on Routine and Special Coagulation Testing Values of Citrate Anticoagulant Adjustment in Patients with High HCT Values." American Journal of Clinical Pathology 2006;126:400-405.)  Routine Hem:  26-May-15 11:20   WBC (CBC) 8.3  RBC (CBC)  4.07  Hemoglobin (CBC)  12.3  Hematocrit (CBC)  38.5  Platelet Count (CBC) 151 (Result(s) reported on 04 Sep 2013 at 11:49AM.)  MCV 95  MCH 30.2  MCHC  31.9  RDW 13.3   Radiology Results: XRay:    26-May-15 11:45, Chest Portable Single View  Chest Portable Single View   REASON FOR EXAM:    Chest Pain  COMMENTS:       PROCEDURE: DXR - DXR PORTABLE CHEST SINGLE VIEW  - Sep 04 2013 11:45AM     CLINICAL DATA:  Shortness of breath.  Chest pain.    EXAM:  PORTABLE CHEST - 1 VIEW    COMPARISON:  Two-view chest 08/29/2013    FINDINGS:  Heart is mildly enlarged without change. Atherosclerotic  calcifications are again seen at the aorta. There is no edema or  effusion to suggest failure. Emphysematous changes are again noted.  The visualized soft tissues and bony thorax are unremarkable.     IMPRESSION:  1. Mild cardiomegaly without failure.  2. Emphysema.  3. Atherosclerosis.  4. No acute cardiopulmonary disease.      Electronically Signed    By: Lawrence Santiago M.D.    On: 09/04/2013 11:48       Verified By: Resa Miner.  MATTERN, M.D.,  Cardiology:    27-May-15 09:20, Echo Doppler  Echo Doppler   REASON FOR EXAM:      COMMENTS:       PROCEDURE: [facility] - ECHO DOPPLER COMPLETE(TRANSTHOR)  - Sep 05 2013  9:20AM     RESULT: Echocardiogram Report    Patient Name:   Jeffery Lambert Date of Exam: 09/05/2013  Medical Rec #:  [id number]           Custom1:  Date of Birth:  06/05/1921        Height:       66.0 in  Patient Age:    42 years         Weight:       122.0 lb  Patient Gender: M                BSA:          1.62 m??    Indications: Atrial Fib  Sonographer:    Sherrie Sport RDCS  Referring Phys: Vivien Presto    Summary:   1. Left ventricular ejection fraction, by visual estimation, is 55 to   60%.   2. Normal global left ventricular systolic function.   3. Mild to moderate aortic regurgitation.   4. Mildly increased left ventricular posterior wall thickness.   5. Mild tricuspid regurgitation.  2D AND M-MODE MEASUREMENTS (normal ranges within parentheses):  Left Ventricle:          Normal  IVSd (2D):      1.28 cm (0.7-1.1)  LVPWd (2D):     1.15 cm (0.7-1.1) Aorta/LA:                  Normal  LVIDd (2D):     4.16 cm (3.4-5.7) Aortic Root (2D): 3.70 cm (2.4-3.7)  LVIDs (2D):     2.98 cm           Left Atrium (2D): 3.60 cm (1.9-4.0)  LV FS (2D):     28.4 %   (>25%)  LV EF (2D):     55.2 %   (>50%)                                    Right Ventricle:  RVd (2D):        7.90 cm  LV DIASTOLIC FUNCTION:  MV Peak E: 0.74 m/s E/e' Ratio: 14.30  MV Peak A: 1.00 m/s Decel Time: 299 msec  E/A Ratio: 0.74  SPECTRAL DOPPLER ANALYSIS (where applicable):  Mitral Valve:  MV P1/2 Time: 86.71 msec  MV Area, PHT: 2.54 cm??  Aortic Valve: AoV Max Vel: 1.38 m/s AoV Peak PG: 7.6 mmHg AoV Mean PG:  LVOT Vmax: 0.78 m/s LVOT VTI:  LVOT Diameter: 2.20 cm  AoV Area, Vmax: 2.16 cm?? AoV Area, VTI:  AoV Area, Vmn:  Aortic Insufficiency:  AI Half-time:  452 msec  AI Decel Rate: 2.51  m/s??  Tricuspid Valve and PA/RV Systolic Pressure: TR Max Velocity: 2.90 m/s RA   Pressure: 5 mmHg RVSP/PASP: 38.6 mmHg  Pulmonic Valve:  PV Max Velocity: 0.74 m/s PV Max PG: 2.2 mmHg PV Mean PG:    PHYSICIAN INTERPRETATION:  Left Ventricle: The left ventricular internal cavity size was normal. LV   septal wall thickness was normal. LV posterior wall thickness was mildly   increased. Global LV systolic function was normal. Left ventricular   ejection fraction, by visual estimation, is 55 to 60%.  Right Ventricle: The right ventricular size is normal. Global RV systolic   function is normal.  Left Atrium: The left atrium is normal in size.  Right Atrium: The right atrium is normal in size.  Pericardium: There is no evidence of pericardial effusion.  Mitral Valve: The mitral valve is normal in structure. Trace mitral valve   regurgitation is seen.  Tricuspid Valve: The tricuspid valve is normal. Mild tricuspid   regurgitation is visualized. The tricuspid regurgitant velocity is 2.90   m/s, and with an assumed right atrial pressure of 5 mmHg, the estimated   right ventricular systolic pressure is normal at 38.6 mmHg.  Aortic Valve: The aortic valve is normal. Mild to moderate aortic valve   regurgitation is seen.  Pulmonic Valve: The pulmonic valve is normal.    [place] MD  Electronically signed by [place] MD  Signature Date/Time: 09/05/2013/8:34:20 PM    *** Final ***    IMPRESSION: .        Verified By: Yolonda Kida, M.D., MD   Assessment/Plan:  Assessment/Plan:  Assessment IMP AFIB->NSR controlled contiue ihalers Weakess stable Mild hypotension stable ECHO was normal Hold off on long term anticoug Cotinue digoxin for rate   Electronic Signatures: Lujean Amel D (MD)  (Signed 27-May-15 21:55)  Authored: Chief Complaint, VITAL SIGNS/ANCILLARY NOTES, Brief Assessment, Lab Results, Radiology Results, Assessment/Plan   Last  Updated: 27-May-15 21:55 by Lujean Amel D (MD)

## 2014-08-03 NOTE — H&P (Signed)
PATIENT NAME:  Jeffery Lambert, Jeffery Lambert MR#:  [id number] DATE OF BIRTH:  [birth year]  DATE OF ADMISSION:  09/04/2013  PRIMARY CARE PHYSICIAN: Dr. Juanetta Gosling.   PRIMARY CARDIOLOGIST: Dr. Darrold Junker.   REFERRING PHYSICIAN: Dr. Sharman Cheek.   CHIEF COMPLAINT: Weakness.   HISTORY OF PRESENT ILLNESS: The patient is a pleasant 81 year old Caucasian male with severe COPD on chronic oxygen, chronic respiratory failure, A-flutter. Of note, he had come to the ER on May 20 for shortness of breath and was discharged with a Levaquin and prednisone taper for bronchitis. At that time, x-ray of the chest, PA and lateral had not shown any pneumonia. The patient, of note, has been using his albuterol nebulizers multiple times a day; however, has not felt better. The patient states that his shortness of breath had been going on for a couple of months and initially was taking 4 to 6 doses of nebulizers; however, now he uses 6 to 8 nebulizers. Of note, yesterday he felt weakness. He had no palpitations or chest pains that he felt per him. He had gone for a follow up with his PCP today, which was post ER visit and routine follow-up and was noted to have significant tachycardia, heart rate of 170s with EKG changes, namely ST in lateral leads. EMS was called and he was brought here. He was given diltiazem IV and now is on diltiazem drip with a heart rate improved. He has no chest pain and hospitalist services were contacted for further evaluation and management.   PAST MEDICAL HISTORY:  History of chronic respiratory failure, on 3 liters of oxygen around the clock, history of COPD, chronic hypoxia, hypertension, anxiety disorder, history of iron deficiency anemia per chart, history of CHF, unknown type, history of A-flutter.   SURGICAL HISTORY: Bilateral inguinal hernia repair, a kidney stone removed, bilateral cataract surgeries.   SOCIAL HISTORY: Wife deceased. Last several years now, he is living by himself. He quit smoking  back in 1982. No history of alcohol or drug use. Lives by himself.   FAMILY HISTORY: Hypertension. Dad died with cancer. Mom died of pneumonia.   OUTPATIENT MEDICATIONS: Levaquin 500 mg daily until the last day being tomorrow, Spiriva 18 mcg inhaled daily, Advair 250/50 mcg 1 puff 2 times a day, albuterol/ipratropium 4 times a day which the patient uses 6 to 8 times a day, aspirin 81 mg daily, digoxin 125 mcg daily, ferrous sulfate 325 mg daily, furosemide 20 mg 2 times a day, ICaps 2 times a day, metoprolol tartrate 25 mg 2 times a day.   REVIEW OF SYSTEMS: CONSTITUTIONAL: Significant weight loss without any fevers. The patient has no chills, had weakness yesterday.   EYES:  Cataract surgery a couple of years ago. No blurry vision or double vision.   EARS, NOSE AND THROAT: Has some hearing loss.  RESPIRATORY: Has chronic COPD, chronic respiratory failure, worsening shortness breath in the last couple of months without any fevers, some intermittent wheezing. No pneumonia.  CARDIOVASCULAR: No chest pain. Did not feel any palpitations. Has history of CHF per chart, unknown type.  No syncope.  GASTROINTESTINAL: No nausea, vomiting, diarrhea, bloody stools or dark stools.  GENITOURINARY: Denies dysuria, hematuria.  HEMATOLOGIC AND LYMPHATIC: Denies easy bruising. Has chronic anemia.  SKIN: No rashes.  MUSCULOSKELETAL: Denies arthritis or gout.  NEUROLOGIC: No focal weakness or numbness.  PSYCHIATRIC: Has chronic anxiety.   PHYSICAL EXAMINATION: VITAL SIGNS: Temperature on arrival 98.4, pulse rate 154, respiratory rate 20, blood pressure 124/71, O2 saturation 86% on  oxygen.  GENERAL: The patient is a thin, cachectic appearing male sitting in bed.   HEENT: Normocephalic, atraumatic. Pupils are equal. Extraocular muscles intact. Moist mucous membranes. Edentulous.  NECK: Supple. No thyroid tenderness. No cervical lymphadenopathy.  CARDIOVASCULAR: S1, S2, irregularly irregular. No significant  murmurs appreciated.  LUNGS: Good air entry. No significant wheezing or rhonchus sounds.  ABDOMEN: Soft, nontender.  EXTREMITIES: No pitting edema.  NEUROLOGIC: Cranial nerves II through XII grossly intact. Strength is five out of five in all extremities. Sensation is intact to light touch.  PSYCHIATRIC: Awake, alert, oriented x3.  SKIN: No obvious rashes or lesions.   LABORATORY AND IMAGING DATA:  X-ray of the chest, single view: Today showing mild cardiomegaly without failure, emphysema and atherosclerosis. No acute cardiopulmonary disease INR 1, PT 13.2. WBC 8.3, hemoglobin 12.3, platelets 151. Troponin negative. Serum digoxin is 1.03. LFTs: Albumin of 3.2, otherwise within normal limits. BUN 28, creatinine 0.77, sodium 139, potassium 3.7 ABG shows pH of 7.45, pCO2 of 51, pO2 of 50.  Lactic acid of 0.5. EKG:  AFib with RVR. Q waves in V1, V2. ST depressions in V4, V5 V6 also some in aVF and no acute ST elevations.   ASSESSMENT AND PLAN: We have a 81 year old male with history of chronic respiratory failure, chronic oxygen, due to chronic obstructive pulmonary disease which appears to be advanced. He used to see Dr. Meredeth IdeFleming who does not see him anymore.  A-flutter who has been using his nebulizers more frequently in the last couple of months due to worsening shortness of breath, who came in for weakness and was found to have atrial fibrillation with rapid ventricular response. At this point, he has no active chest pains. He has no significant shortness of breath worsening than his baseline and he is on a diltiazem drip. Would admit the patient to the Intensive Care Unit with a diltiazem drip. We will hold the beta blocker given his significant chronic obstructive pulmonary disease and slowly reintroduce diltiazem p.o. He is not on any anticoagulation. Given the EKG changes, I would start him on heparin drip for now, obtain Cardiology consult, obtain echocardiogram and cycle the troponins. I suspect ST  depressions are secondary to tachycardia that has been going on. I suspect that this tachycardia likely started yesterday but we are not certain. He has a negative troponin and of note, no chest pain. Would continue the aspirin and follow with him in the Intensive Care Unit. Would continue his oxygen at 3 liters. He was counseled against  using excessive nebulizers for now. Would continue Advair, Spiriva and Xopenex p.r.n. at this time if he does needed. Would continue Levaquin until it is done tomorrow. Would check a TSH. I do not believe he in acute chronic obstructive pulmonary disease flare at this time. The patient is DO NOT RESUSCITATE per his wishes.    Total critical care Time Spent: About 50 minutes    ____________________________ Krystal EatonShayiq Kathy Wahid, MD sa:dp D: 09/04/2013 13:26:31 ET T: 09/04/2013 13:51:38 ET JOB#: [id number]  cc: Krystal EatonShayiq Jeda Pardue, MD, <Dictator> Dr. Marigene EhlersHawkins Alexander Paraschos, MD Krystal EatonSHAYIQ Shalon Salado MD ELECTRONICALLY SIGNED 09/20/2013 10:54

## 2014-08-03 NOTE — Discharge Summary (Signed)
PATIENT NAME:  Jeffery Lambert, Jeffery Lambert MR#:  [id number] DATE OF BIRTH:  February 08, 1922  DATE OF ADMISSION:  01/03/2014 DATE OF DISCHARGE:  01/07/2014   ADMITTING DIAGNOSIS: Falls and weakness.   DISCHARGE DIAGNOSES:  1.  Weakness due to symptomatic bradycardia. 2.  Symptomatic bradycardia likely due to digoxin toxicity as well as amiodarone and Cardizem. Now heart rate is stable. Seen by cardiology who recommended just holding heart rate blocking medications.  3.  Acute renal failure due to dehydration, resolved with IV fluids.  4.  Severe hypothyroidism. Possible amiodarone-induced. The patient was started on low-dose Synthroid.  5.  Atrial fibrillation/atrial flutter, off amiodarone, Cardizem and digoxin. Heart rate needs to be monitored. He is not a good anticoagulation candidate.  6.  Borderline troponin, felt to be due to demand ischemia.  7.  Chronic obstructive pulmonary disease with chronic respiratory failure requiring chronic oxygen.  8.  History of eczema.  9.  Anemia, likely due to anemia of chronic disease.   CONSULTANTS: Palliative care, cardiology.   PERTINENT LABORATORIES AND EVALUATIONS: Admitting glucose 108, BUN 24, creatinine 2.0, sodium 138, potassium 5.1, chloride 95, CO2 is 37, calcium was 8.3. LFTs were normal. Troponin less than 0.02. TSH greater than 100. Free thyroxine was 0.23. Digoxin level of 4.5. WBC 6.5, hemoglobin 9.5, platelet count was 133,000.   Chest x-ray shows mild bibasilar atelectasis changes.   HOSPITAL COURSE: Please refer to H and P done by the k physician. The patient is a 81 year old white male with a history of COPD on chronic home oxygen therapy. He has a history of atrial flutter, anemia of chronic disease, history of SVT in the past, came in with falls and weakness. The patient was brought to the ED and was noted to have severe digoxin toxicity. He received IV Digibind with decrease in his digoxin level. The patient was monitored in the CCU closely. His  heart rate improved. The patient was also on additional heart blocking agents, including amiodarone and Cardizem, which have since been discontinued. His heart rate is stable in the 50s. Cardiology recommends no pacemaker. He was seen by palliative care and was made a DNR. The patient at this point is doing better and is stable for discharge.   DISCHARGE MEDICATIONS: Aspirin 81 mg 1 tablet p.o. daily, Spiriva 18 mcg daily, Advair 250/50 1 puff b.i.d., Lasix 20 mg 1 tablet p.o. b.i.d., ICaps 1 tablet p.o. b.i.d., albuterol ipratropium 4 times a day as needed, iron sulfate 325 mg 1 tablet p.o. b.i.d., levothyroxine 25 mcg daily.   DIET: Low sodium, low fat, low cholesterol. Oxygen via nasal cannula at 3 L.   DIET: Low-sodium, low-fat.   ACTIVITY: As tolerated.   FOLLOWUP: Follow with primary MD in 1-2 weeks. Follow with Dr. Gwen PoundsKowalski next week.   TIME SPENT ON THIS PATIENT: 40 minutes on the discharge.     ____________________________ Lacie ScottsShreyang H. Allena KatzPatel, MD shp:MT D: 01/07/2014 11:26:55 ET T: 01/07/2014 11:47:42 ET JOB#: [id number]  cc: Landa Mullinax H. Allena KatzPatel, MD, <Dictator> Charise CarwinSHREYANG H Everest Brod MD ELECTRONICALLY SIGNED 01/11/2014 17:58

## 2014-08-03 NOTE — H&P (Signed)
PATIENT NAME:  Jeffery Lambert, Jeffery Lambert MR#:  [id number] DATE OF BIRTH:  Nov 14, 1921  DATE OF ADMISSION:  01/03/2014  PRIMARY CARE PHYSICIAN:  Dr. Juanetta GoslingHawkins.    PRIMARY CARDIOLOGIST: Dr. Darrold JunkerParaschos.   CHIEF COMPLAINT: Falls and weakness.   HISTORY OF PRESENT ILLNESS: Jeffery Lambert is a pleasant 81 year old Caucasian gentleman with past medical history of COPD on home oxygen, history of atrial flutter, anemia of chronic disease, and also history of SVT in the remote past, who comes to the Emergency Room after he has had falls at home. He feels very weak. The patient denies any injury. He does have some ecchymosis on both his upper extremities. He was found to have elevated creatinine of 2, his baseline is 0.7 and was also found to have serum digoxin level of 4.5. The patient was found to be bradycardic with heart rate in the 30s. He currently denies any chest pain or shortness of breath. The patient is being admitted with acute renal failure and digoxin toxicity with symptomatic bradycardia resulting into falls.   PAST MEDICAL HISTORY:  1. Chronic COPD/emphysema on home oxygen.  2. Anxiety.  3. History of eczema.  4. History of atrial flutter with history of SVT in the past, last admission for it was in May 2015.  5. Iron deficiency anemia.  6. Failure to thrive.   SOCIAL HISTORY: The patient lives at home by himself. His brother who lives in [place] checks on him.  He is an ex-smoker. Denies any alcoholic drink.   ALLERGIES:  CODEINE, PENICILLIN, AND ZITHROMAX.   MEDICATIONS:  1.  Advair 1 puff b.i.d.  2.  Ferrous sulfate 325 mg p.o. daily.  3.  Digoxin 125 mcg p.o. daily.  4.  Cardizem CD 120 p.o. daily.  5.  Aspirin enteric-coated p.o. daily.  6.  Amiodarone 200 mg daily.  7.  DuoNebs 3 mL every 4 hours.  8.  Spiriva HandiHaler daily.  9.  ICAPS 1 tablet daily.  10.  Lasix 20 mg b.i.d.  11.  Ferrous sulfate 325 p.o. daily.   FAMILY HISTORY: Father died of some cancer, mother died of  pneumonia.   REVIEW OF SYSTEMS:    CONSTITUTIONAL: Positive for fatigue, weakness.  EYES: No blurred or double vision or cataracts.  EARS, NOSE, AND THROAT: No tinnitus, ear pain, hearing loss, or sinus pain.  RESPIRATORY: No cough, wheeze, hemoptysis, or asthma.  CARDIOVASCULAR: No chest pain, orthopnea, edema. Positive for admit with bradycardia. No palpitations.  GASTROINTESTINAL: No nausea, vomiting, diarrhea, abdominal pain. No GERD.  GENITOURINARY: No dysuria, hematuria, or frequency.  ENDOCRINE: No polyuria, nocturia, or thyroid problems.  HEMATOLOGY: Positive for chronic anemia. No easy bruising or bleeding.  SKIN:  Positive for ecchymosis and bruises over the upper extremities.  MUSCULOSKELETAL: Positive for arthritis.  NEUROLOGIC: Positive for weakness. No CVA, TIA, or seizures.  PSYCHIATRIC: No anxiety, depression.   All other systems reviewed and negative.   PHYSICAL EXAMINATION:  GENERAL: The patient is very thin and lean, cachectic.   VITAL SIGNS:  Temperature 97.5, pulse is 38, respirations 14,  blood pressure is 111/49, saturations are 98% on 3 liters nasal cannula oxygen.  HEENT: Atraumatic, normocephalic. Pupils PERRLA.  EOM intact. Oral mucosa is dry. Edentulous.  NECK: Supple. No JVD. No carotid bruit.  LUNGS: Clear to auscultation bilaterally, distant breath sounds. No respiratory distress or labored breathing, audible wheezing.  CARDIOVASCULAR: Both the heart sounds are normal. Rate is bradycardic, rhythm is regular. There is a systolic murmur heard  at the apex.   CHEST: Nontender.  EXTREMITIES: Good pedal pulses, good femoral pulses. No lower extremity edema.  ABDOMEN: Soft, benign, nontender. No organomegaly. Positive bowel sounds.  RECTAL: Per Dr. Darnelle Catalan positive for Hemoccult stools.  NEUROLOGIC: Grossly appears intact. No significant motor or sensory deficit. Generalized weakness.  SKIN: Ecchymosis in both upper extremities. Minimal skin tear on the right  elbow.   LABORATORY DATA:  1.  Echo Doppler done on 09/05/2013 showed EF of 55%-60%, global left ventricular systolic function, mild to moderate aortic regurgitation, mild tricuspid regurgitation.  2.  Troponin is 0.02.  Serum digoxin is 4.5. Creatinine is 2.0, BUN is 44, glucose is 108, potassium is 5.1, chloride is 95, bicarbonate is 37, calcium is 8.3. White count is 6.5, H and H  is 9.5 and 28.6, platelet count is 133,000. MCV is 97.  3.  EKG shows sinus bradycardia. Junctional bradycardia.   ASSESSMENT AND PLAN: A 81 year old, Jeffery Lambert, with history of atrial flutter/supraventricular tachycardia, chronic  obstructive pulmonary disease/emphysema on home oxygen, comes in with:   1.  Severe digoxin toxicity in the setting of acute renal failure with symptomatic bradycardia resulting in falls at home. The patient will be admitted in the intensive care unit. Regular cardiac diet. We will give IV fluids for hydration, IV Digibind has been ordered by ER physician. We will monitor digoxin level and serum potassium as well. I will go ahead and give the patient some calcium gluconate along with dextrose and insulin in order to bring his potassium less than 5.  I spoke with Dr. Gwen Pounds who will see the patient in consultation, agrees with plan. Echo was recently done in May 2015, hence we will not repeat it. We will cycle cardiac enzymes x 3. 2.  Symptomatic bradycardia with a history of atrial flutter and resulting in falls. Symptomatic treatment as above. We will monitor closely in the CCU. Hold off on rate blocking agents which are Cardizem, digoxin, and amiodarone for now.  3.  Chronic obstructive pulmonary disease on home oxygen, chronic with severe chronic respiratory failure/emphysema. The patient does not appear to be in respiratory distress. We will continue home oxygen and inhalers.  4.  History of deficiency anemia with Hemoccult positive stools. The patient's hemoglobin in May of 2015 was  11.5, he is down to 9.5. Will order Hemoccult stools. Once stable the patient will benefit from GI workup if he is agreeable to it. There is a possibility he may be having a slow GI bleed. I will hold off on baby aspirin at this time.  5.  Failure to thrive, cachexia. We will have dietitian see patient. I will also have palliative care consultation.  6.  Deep vein thrombosis prophylaxis.  We will give SCDs and TEDs at present.  I will avoid antiplatelet agents in the setting of positive Hemoccult stools and anemia.  7.  Acute renal failure. Continue IV fluids for hydration, monitor ins and outs, avoid nephrotoxins, and consider nephrology consultation if needed. I will hold off on Lasix for now. 8.  Above was discussed with the patient and the patient's brother Jeffery Lambert who is his healthcare power of attorney.    CODE STATUS: The patient is a no code, DNR.    CRITICAL TIME SPENT: 60 minutes.     ____________________________ Wylie Hail Allena Katz, MD sap:bu D: 01/03/2014 17:55:58 ET T: 01/03/2014 18:41:26 ET JOB#: [id number]  cc: Takiesha Mcdevitt A. Allena Katz, MD, <Dictator> Dr. Elonda Husky MD ELECTRONICALLY SIGNED 01/17/2014  16:56 

## 2014-08-03 NOTE — Consult Note (Signed)
PATIENT NAME:  Jeffery Lambert, Jeffery Lambert MR#:  [id number] DATE OF BIRTH:  Sep 20, 1921  DATE OF CONSULTATION:  01/04/2014   CONSULTING PHYSICIAN:  Lamar BlinksBruce J. Kowalski, MD   REASON FOR CONSULTATION: Atrial fibrillation with slow ventricular rate, chronic kidney disease, anemia, chronic obstructive pulmonary disease.   CHIEF COMPLAINT: Weak.  HISTORY OF PRESENT ILLNESS:  This is a 81 year old male with known chronic nonrheumatic atrial fibrillation, chronic kidney disease stage 3, anemia and chronic obstructive pulmonary disease, who has been stable for quite some time on appropriate medication management.  The patient has not been on anticoagulation due to significant concerns of fall risk. The patient has had new onset of atrial fibrillation with slow ventricular rate, and has had weakness, dizziness over the last 3 to 4 days, but significant to the point where he was seen in the Emergency Room with a slow rate of 30 beats per minute with a blood pressure which is relatively stable.  The patient has had improvements of this with digoxin binding.  The patient is hemodynamically stable today with no evidence of exacerbation of COPD and a creatinine of 1.8.   REVIEW OF SYSTEMS: The remainder review of systems cannot be assessed due to the patient's inability to discuss.   PAST MEDICAL HISTORY:  1.  Atrial fibrillation.  2.  Chronic kidney disease.  3.  Anemia.  4.  Chronic obstructive pulmonary disease.   FAMILY HISTORY: No family members with early onset of cardiovascular disease or hypertension.   SOCIAL HISTORY: Currently denies alcohol or tobacco use.   ALLERGIES: As listed.   MEDICATIONS: As listed.   PHYSICAL EXAMINATION:  VITAL SIGNS: Blood pressure 89/60 bilaterally. Heart rate is 39 upright, reclining, and slightly irregular.  GENERAL: He is a well-appearing elderly male in no acute distress.  HEENT: No icterus, thyromegaly, ulcers, hemorrhage, or xanthelasma.  CARDIOVASCULAR: Irregularly  irregular slow rate. Normal S1 and S2, 2/6 right upper sternal border murmur, nonradiating.  PMI is diffuse.  Carotid upstroke normal without bruit. Jugular venous pressure is normal.  LUNGS: Have few basilar crackles with normal respirations.  ABDOMEN: Soft, nontender, without hepatosplenomegaly or masses. Abdominal aorta is normal size without bruit.  EXTREMITIES: 2+ radial, trace femoral and dorsal pedal pulses, with no lower extremity edema, cyanosis, clubbing or ulcers.  NEUROLOGIC: He is oriented to time, place, and person, with normal mood and affect.   ASSESSMENT: A 81 year old male with chronic kidney disease stage 3, minimal elevation of troponin consistent with demand ischemia, rather than acute coronary syndrome, anemia, chronic obstructive pulmonary disease with atrial fibrillation with slow ventricular rate.   RECOMMENDATIONS:  1.  Discontinuation of amiodarone, digoxin and diltiazem due to slow rate and follow for other issues of sick sinus syndrome requiring further intervention including the possibility of pacemaker placement.  2.  Continue to follow chronic kidney disease stage 3 with hydration. 3.  No further intervention of elevated troponin of 0.06, consistent with demand ischemia rather than acute coronary syndrome.  4.  No further intervention of other cardiac issues with an ejection fraction of 60% and no evidence of heart failure.  5.  Consider anticoagulation and further evaluation of fall risk if necessary.     ____________________________ Lamar BlinksBruce J. Kowalski, MD bjk:DT D: 01/04/2014 07:32:13 ET T: 01/04/2014 08:04:06 ET JOB#: [id number]  cc: Lamar BlinksBruce J. Kowalski, MD, <Dictator> Lamar BlinksBRUCE J KOWALSKI MD ELECTRONICALLY SIGNED 01/09/2014 15:48

## 2014-08-03 NOTE — Discharge Summary (Signed)
PATIENT NAME:  Jeffery Lambert, Jeffery Lambert MR#:  [id number] DATE OF BIRTH:  May 26, 1921  DATE OF ADMISSION:  09/04/2013 DATE OF DISCHARGE:  09/07/2013  CONSULTANTS:  Dr. Juliann Paresallwood from cardiology.   PRIMARY CARE PHYSICIAN:  Dr. Juanetta GoslingHawkins.   PRIMARY CARDIOLOGIST:  Dr. Darrold JunkerParaschos.   CHIEF COMPLAINT:  Weakness.   DISCHARGE DIAGNOSES: 1.  Atrial fibrillation with rapid ventricular response, now sinus rhythm.  2.  Supraventricular tachycardia.  3.  Chronic respiratory failure.  4.  Chronic obstructive pulmonary disease.  5.  History of atrial flutter.  6.  History of hypertension.  7.  History of chronic anemia.  8.  History of chronic respiratory failure on 3 liters of oxygen around-the-clock.  9.  Anxiety disorder.  10.  History of iron deficiency anemia.   MEDICATIONS AT THE TIME OF DISCHARGE:  Aspirin 81 mg daily, ferrous sulfate 325 mg once a day, Spiriva 18 mcg inhaled one cap daily, Advair 250/50 mcg 1 puff 2 times a day, digoxin 125 mcg daily, furosemide 20 mg 2 times a day, ICaps 1 tab 2 times a day, albuterol/ipratropium 3 mL 4 times a day as needed for wheezing or shortness of breath, amiodarone 400 mg every 12 hours for seven days, then 200 mg daily, Cardizem 120 mg extended-release one cap once a day.    He will be going home with home oxygen 3 liters via nasal cannula.   DIET:  Low sodium.   ACTIVITY:  As tolerated.   FOLLOWUP:  Please follow with PCP within 1 to 2 weeks.  Please follow with cardiology, Dr. Juliann Paresallwood within a week.  If any shortness of breath, palpitations, chest pains, dizziness, call your doctor right away.   DISPOSITION:  Home.   SIGNIFICANT LABORATORY AND IMAGING:  Echocardiogram showing normal EF of 55% to 60%.  Initial BUN 28, creatinine 0.77, sodium 139, potassium 3.7.  LFTs within normal limits x 3.  CK-MB negative x 3.  TSH of 5.05.  Digoxin level on arrival 1.03.  Initial white count of 8.3, hemoglobin 12.3.  UA did not suggest an infection.  X-ray of the chest,  portable on admission showing mild cardiomegaly without failure, emphysema and atherosclerosis.    HISTORY OF PRESENT ILLNESS AND HOSPITAL COURSE:  For full details of H and P, please see the dictation on May 26th by Dr. Jacques NavyAhmadzia, but briefly this is a 81 year old with chronic respiratory failure, history of A-flutter who came in for shortness of breath to the ER on May 20th, was discharged with Levaquin and prednisone taper for bronchitis.  At that time x-ray of the chest, PA and lateral, had not shown a pneumonia.  Of note, over the last few months he has been using his nebulizers excessively 6 to 8 vials per day for feelings of shortness of breath.  He had gone for a follow-up from the ER to the PCP's office and there was noted to have significant tachycardia, heart rate of 170s with some EKG changes in the lateral leads and was sent here.  Here, he received diltiazem IV in the ER and was started on a diltiazem drip for A. Fib with RVR and given the ST changes was also started on heparin drip.  He was admitted to the ICU and cardiology was consulted.  An echocardiogram was obtained.  He was ruled out for acute coronary syndrome with 3 negative troponins.  He converted to normal sinus rhythm and was transitioned to diltiazem by mouth.  He was seen by Dr. Juliann Paresallwood.  He did have bouts which was short of SVTs requiring IV beta blocker.  This was discussed with Dr. Juliann Pares who recommended starting the patient on amiodarone 400 mg twice a day for a week, then taper down to 200 mg daily, henceforth and no anticoagulation is recommended at this time.  At this time he is in sinus rhythm.  He was seen by physical therapy and their recommendation is home.  In regards to his COPD, he was counseled excessively about abusing his nebulizers and I suspect that using his albuterol 6 to 8 times a day for some time likely triggered A. Fib with RVR and SVTs.  At this time he is ambulating.  He is on his outpatient oxygen rates.     PHYSICAL EXAMINATION:  VITAL SIGNS:  Temperature is 97.8, pulse rate 84, respiratory rate 20, blood pressure 118/53, O2 sat 98% on 3 liters.  GENERAL:  The patient is  male sitting in the chair in no obvious distress.  HEART:  Normal S1, S2.  No significant murmurs.  LUNGS:  Clear without significant wheezing or rhonchi.  No pitting edema.   Total time spent is 40 minutes.   CODE STATUS:  THE PATIENT IS DO NOT RESUSCITATE PER HIS WISHES.    ____________________________ Krystal Eaton, MD sa:ea D: 09/07/2013 16:05:10 ET T: 09/08/2013 02:10:04 ET JOB#: [id number]  cc: Krystal Eaton, MD, <Dictator> Marcina Millard, MD Janeann Forehand., MD Dwayne D. Juliann Pares, MD Krystal Eaton MD ELECTRONICALLY SIGNED 09/20/2013 10:56

## 2014-08-04 NOTE — Discharge Summary (Signed)
PATIENT NAME:  Jeffery Lambert, Jeffery Lambert MR#:  [id number] DATE OF BIRTH:  1921-11-05  DATE OF ADMISSION:  04/14/2011 DATE OF DISCHARGE:  04/16/2011  DISCHARGE DIAGNOSES:  1. Syncope of unclear etiology.  2. Chronic obstructive pulmonary disease, on home O2. 3. Hypertension. 4. Anxiety.  5. Iron deficiency anemia.  6. History of atrial fibrillation.  7. Difficulty with speech going on for months.  8. Leukocytosis.  9. Hemodilution anemia.  10. Hyperglycemia.   DISPOSITION: The patient is being discharged home with Home Health and Physical Therapy.   DIET: Low sodium, 1800 calorie ADA diet.   ACTIVITY: As tolerated.   OXYGEN: 3 liters via nasal cannula.   DISCHARGE MEDICATIONS:  1. Oxygen 3 liters via nasal cannula.  2. Advair 250/50, 1 puff b.i.d.  3. Lopressor 25 mg daily.  4. Digoxin 250 mcg daily.  5. DuoNebs every 4 hours p.r.n.  6. Spiriva 18 mcg inhaled daily.  7. Clorazepate 7.5 mg daily.  8. Aspirin 81 mg daily.  9. Ferrous sulfate 325 mg daily.    LABORATORY, DIAGNOSTIC AND RADIOLOGICAL DATA:  1. CAT scan of the head showed no acute cardiopulmonary pathology.  2. CAT scan of the chest showed emphysema with no evidence of PE or infiltrate.  3. Chest x-ray showed no acute cardiopulmonary pathology.  4. Carotid ultrasound showed no hemodynamically significant stenosis.  5. Echo showed ejection fraction of 50 to 55% with moderate concentric left ventricular hypertrophy. Left ventricular systolic wall motion was normal. There was mild-to-moderate MR, moderate TR, mild AR.  6. Urinalysis showed no evidence of infection.  7. White count was 13.0 on admission, 11.7 by the time of discharge.   8. Mild thrombocytopenia with platelet count of 149, hemoglobin 12.9.  9. Glucose 141. Normal electrolytes.  10. Normal adrenal function normal.  11. Normal cardiac enzymes.   HOSPITAL COURSE: The patient is an 81 year old male with past medical history of chronic obstructive pulmonary  disease, chronic respiratory failure on home O2, hypertension, who presented with a syncopal episode. The patient's syncope we was extensively evaluated. He was monitored on telemetry, which showed no arrhythmias. Serial cardiac enzymes were checked which were negative. The patient was not orthostatic. Carotid ultrasound and CAT scan of the head were negative. Echocardiogram showed maintained ejection fraction of 50 to 55%. The patient was also evaluated by physical therapy and ambulated well. He had no episodes of syncope while in the hospital. He did have increase in his heart rate at the time of stress; therefore, he was started on low-dose beta blocker with good control of his symptoms. The patient had no evidence of chronic obstructive pulmonary disease exacerbation while in the hospital. He has difficulty with speech which has been going on for months, possibly due to oropharyngeal dysfunction. He has been advised to follow-up with ENT as an outpatient. The patient is being discharged home in a stable condition with Home Health and Physical Therapy.   Time spent 45 min  ____________________________ Darrick MeigsSangeeta Jaythen Hamme, MD sp:cbb D: 04/16/2011 16:43:56 ET T: 04/19/2011 10:42:48 ET JOB#: [id number]  cc: Darrick MeigsSangeeta Traveon Louro, MD, <Dictator> Darrick MeigsSANGEETA Muhammed Teutsch MD ELECTRONICALLY SIGNED 04/19/2011 12:17

## 2014-08-04 NOTE — Discharge Summary (Signed)
PATIENT NAME:  ETHRIDGE, SOLLENBERGER MR#:  [id number] DATE OF BIRTH:  02/11/1922  DATE OF ADMISSION:  04/03/2011 DATE OF DISCHARGE:  04/07/2011  DISCHARGE DIAGNOSES:  1. Chronic obstructive pulmonary disease exacerbation. 2. Acute on chronic respiratory failure due to chronic obstructive pulmonary disease exacerbation. 3. History of hypertension. 4. Anxiety disorder. 5. Iron deficiency anemia. 6. History of atrial flutter before.   DISCHARGE MEDICATIONS:  1. Ferrous sulfate 325 mg p.o. daily.  2. Spiriva 1 capsule inhalation daily.  3. Albuterol nebulizers every four hours as needed.  4. Oxygen 2 liters via nasal cannula.  5. Clorazepate 7.5 mg p.o. daily.  6. Lasix 40 mg p.o. daily.   NEW MEDICINES:  1. Prednisone 40 mg p.o. daily for two days, 30 mg p.o. daily for two days, 20 mg p.o. daily for two days, then 10 mg p.o. daily for two days and then stop.  2. Levaquin 500 mg p.o. daily for two days.  FOLLOW UP: Patient needs to follow up with ENT because he has stuttering and it gets worse and he has trouble breathing. Advised patient's wife also to take him to ENT appointment with Dr. Linus Salmons. Also patient is followed with Dr. Cay Schillings, his primary doctor again in 1 to 2 weeks.   LABORATORY, DIAGNOSTIC AND RADIOLOGICAL DATA:  Chest x-ray on 12/21 showed no pneumonia, has chronic obstructive pulmonary disease.   Electrolytes on admission: Sodium 140, potassium 4.5, chloride 100, bicarbonate 33, BUN 13, creatinine 0.8, glucose 118. Liver function within normal limits. Troponin 0.02. ABG: pH 7.39, pCO2 53, pO2 77, this is on 2 liters, saturation 95.1%. WBC 7.4, hemoglobin 11, hematocrit 33.3, platelets 151. CT of the head showed stable CT with evidence of atrophy and chronic small ischemic changes.   PHYSICAL EXAMINATION: VITAL SIGNS: Patient's vitals this morning: Temperature 97.5 Fahrenheit, pulse 58, respirations 20, blood pressure 110/55, sats 94% on 2 liters.   ALLERGIES:  Patient allergic to penicillin, Zithromax and codeine.   HOSPITAL COURSE: 81 year old male with history of chronic obstructive pulmonary disease on 2 liters of oxygen at home came in because of:  1. Trouble breathing associated with cough and thick phlegm. Chest x-ray showed no pneumonia on admission. He was admitted to hospitalist service for acute on chronic respiratory failure with chronic obstructive pulmonary disease exacerbation. Started on steroids along with nebulizer. Patient says that he is feeling much better. His lungs are also much clearer today. He will be discharged home with tapering course of prednisone along with Levaquin. He already finished five days of Levaquin, will give two more doses to finish seven days and also continue oxygen at home along with nebulizers as needed.  2. History of iron deficiency anemia. Patient is on ferrous sulfate 325 mg p.o. daily.  3. History of anxiety. He is on clorazepate. He has stuttering likely related to anxiety and trouble breathing so he is advised to follow up with ENT. Patient also had a CT of the head to evaluate because of his stuttering and CT head did not show any acute changes.  4. History of atrial flutter, stable. He is on nadolol at home and the heart rate is around 58 here and he was getting nadolol 50 daily, so I stopped that here and he can have nadolol after he follows up with Dr. Cay Schillings as an outpatient. Patient's blood pressure is stable. He is on Lasix 40 mg daily, advised to continue that.  5. Patient's condition is stable at this time.  TIME SPENT ON DISCHARGE PREPARATION: More than 30 minutes.  ____________________________ Katha HammingSnehalatha Ugo Thoma, MD sk:cms D: 04/07/2011 12:14:41 ET T: 04/08/2011 10:59:40 ET JOB#: [id number] cc: Katha HammingSnehalatha Jamilah Jean, MD, <Dictator> Mickie HillierJack H. Sheppard PentonWolf, MD Katha HammingSNEHALATHA Bree Heinzelman MD ELECTRONICALLY SIGNED 04/25/2011 16:45

## 2014-08-04 NOTE — H&P (Signed)
PATIENT NAME:  Jeffery Lambert, Jeffery Lambert MR#:  [id number] DATE OF BIRTH:  02-25-1922  DATE OF ADMISSION:  04/14/2011  ER REFERRING PHYSICIAN: Dana Allan, MD   PRIMARY CARE PHYSICIAN: Cay Schillings, MD   CHIEF COMPLAINT: Syncope.   HISTORY OF PRESENT ILLNESS: The patient is a pleasant 81 year old white male with history of chronic obstructive pulmonary disease, chronic respiratory failure, history of anxiety disorder, hypertension, has had some difficulty with speech over the past few months, who was admitted from 12/22 to 12/26 with acute chronic obstructive pulmonary disease exacerbation, had a negative chest x-ray, stayed in the hospital for a few days. His respiratory status improved. The patient was subsequently discharged home. Since being discharged home, the patient has had multiple episodes of syncope. Initially, the first episode happened last Thursday where his wife heard a thump and he was on the floor. Subsequently, his son stayed with him and the next day he had an episode where he states that he became very weak. His knees buckled and then he passed out. He has had 2 to 3 more episodes of a similar thing, mainly with standing or going to the bathroom. Due to these symptoms, they brought him back. He reports that his breathing is improved compared to his recent hospitalization and is currently at baseline. He has not had any chest pains or palpitations. He denies any fevers or chills. He has been eating and drinking okay. He has difficulty with speech which has been going on for the past few months. During his recent hospitalization, he had a CT scan of the head which was negative for stroke. He had a head and neck CT which showed no pathology. It was felt that he may have either some vocal cord dysfunction or some weakness in the pharyngeal muscles as aging. He was referred to ENT but was not able to make the appointment.   PAST MEDICAL HISTORY:  1. History of respiratory failure. He is on 2 liters  of oxygen.  2. Chronic obstructive pulmonary disease.  3. Chronic hypoxemia.  4. Hypertension.  5. Anxiety disorder.  6. History iron deficiency anemia.  7. History of atrial flutter.  PAST SURGICAL HISTORY:  1. Status post bilateral inguinal hernia repairs.  2. History of bilateral cataract surgeries.   ALLERGIES: Codeine, penicillin and Zithromax.   CURRENT MEDICATIONS:  1. Iron sulfate 325 mg p.o. daily.  2. Spiriva 1 INH daily.  3. Albuterol nebulizers every 4 hours p.r.n.  4. Clorazepate 7.5 mg p.o. daily.  5. Lasix 40 mg daily.  6. Albuterol-Atrovent nebulizers every 4 hours as needed   SOCIAL HISTORY: He used to smoke, quit in 1982. No prior history of alcohol or drug abuse. He is retired from working in the [employer]. He is married, living with his wife.   FAMILY HISTORY: History of hypertension.    REVIEW OF SYSTEMS: CONSTITUTIONAL: Denies any fevers. Complains of fatigue, weakness. No pain. No weight loss. No weight gain. EYES: No blurred or double vision. No pain. No redness. No inflammation. No glaucoma. No cataracts. ENT: No tinnitus. No ear pain. He does have some hearing loss. No seasonal or year-round allergies. No epistaxis. No nasal discharge. No snoring. No postnasal drip. No sinus pain. No dentures. No redness of the mouth. No difficulty swallowing. RESPIRATORY: No cough, intermittent wheezing. No hemoptysis. Has chronic dyspnea, has chronic obstructive pulmonary disease. No tuberculosis. CARDIOVASCULAR: No chest pain. No orthopnea. No edema.  Does have a history of atrial flutter in the past.  No history of syncope until this recent episode over the past week. He does have a history of hypertension. GI: No nausea, vomiting, diarrhea. No abdominal pain. No hematemesis. No melena. No ulcer. No gastroesophageal reflux disease. No irritable bowel syndrome. No rectal bleeding. No changes in bowel habits. GENITOURINARY: Denies any dysuria, hematuria, renal calculus.  ENDOCRINE: Denies any polyuria, nocturia, or thyroid problems. HEME/LYMPH: Does have a history of anemia. No easy bruisability. No bleeding. No swollen glands. MUSCULOSKELETAL: Denies any pain in the neck, back, shoulders, or knees. NEUROLOGICAL: No numbness. No weakness. No dysarthria. No cerebrovascular accident. No transient ischemic attack. PSYCHIATRIC: Has history of anxiety and no anxiety currently. No insomnia. No ADD. No OCD.    PHYSICAL EXAMINATION:  GENERAL: The patient is a pleasant 81 year old male in no acute distress currently.   HEENT: Head atraumatic, normocephalic. Pupils are equally round, reactive to light and accommodation. Extraocular movements are intact. Oropharynx is clear. Nasal exam shows no ulcerations, no drainage. Ear exam shows no erythema or swelling.   NECK: Exam shows no thyromegaly, no carotid bruits. No JVD.  CARDIOVASCULAR: Regular rate and rhythm. No murmurs, rubs, clicks, or gallops. PMI is not displaced.   LUNGS: Diminished breath sounds without any active rales, rhonchi, or wheezing.   ABDOMEN: Soft, nontender, nondistended. Positive bowel sounds x4. There is no hepatosplenomegaly.   EXTREMITIES: He has no clubbing, cyanosis, or edema.   NEUROLOGICAL: Currently, the patient is awake, alert, oriented x3. Cranial nerves II through XII are grossly intact. No focal deficits.   SKIN: There is no rash.   LYMPHATICS: No lymph nodes palpable.   MUSCULOSKELETAL: There is no erythema or swelling.   VASCULAR: Good DP, PT pulses.   PSYCHIATRIC: Not anxious or depressed.   LABORATORY, DIAGNOSTIC AND RADIOLOGICAL DATA:  Troponin 0.03. CPK 34, CK-MB 2.5. INR 0.9.  WBC count 13.0, hemoglobin 14, platelet count 154.  Glucose 141, BUN 20, creatinine 0.71, sodium 141, chloride 96, CO2 is 35.  LFTs are normal with albumin of 3.2.  ABG: pH of 7.47, pCO2 of 46, pO2 of 64, FiO2 28%.  Chest x-ray shows no acute cardiopulmonary processes.  CT scan of the head shows  chronic small vessel ischemic changes.  Urinalysis shows leukocytes negative.   ASSESSMENT AND PLAN: The patient is an 80 year old white male with recent hospitalization for chronic obstructive pulmonary disease exacerbation, discharged on 12/26, comes in due to multiple episodes of syncope, especially with standing.   1. Syncope, multiple episodes, ? orthostatic hypotension: I will check his orthostatics which haven't been checked here. I  will discontinue his Lasix, I gave him some IV fluids. Also, the patient is hypoxic even on 2 liters of O2, possible hypoxia getting worse with standing. We will increase his O2. I will place him on telemetry to rule out any arrhythmias. I will check an echocardiogram and carotid Dopplers. I will get Physical Therapy to ambulate the patient and see what happens.  2. Chronic obstructive pulmonary disease without any acute exacerbation: Continue nebulizers, Spiriva. We will add Advair to his regimen.  3. Chronic respiratory failure: I will increase oxygen to 3 liters.  4. Difficulty with speech going on for months, negative head CT scan during recent hospitalization: Likely some oropharyngeal dysfunction. He has an ENT appointment which he was not able to keep.  5. History of hypertension: Blood pressure currently is lower. We will discontinue Lasix, give him some IV fluids.  6. Leukocytosis: Due to likely prednisone therapy which he was still  taking. I will follow his WBC count.   CODE STATUS:  FULL CODE.     TIME SPENT: 35 minutes.   ____________________________ Lacie ScottsShreyang H. Allena KatzPatel, MD shp:cbb D: 04/14/2011 15:32:19 ET T: 04/14/2011 16:04:20 ET JOB#: [id number]  cc: Aaleeyah Bias H. Allena KatzPatel, MD, <Dictator> Mickie HillierJack H. Sheppard PentonWolf, MD Charise CarwinSHREYANG H Anfernee Peschke MD ELECTRONICALLY SIGNED 04/17/2011 15:24

## 2014-08-11 NOTE — H&P (Signed)
PATIENT NAME:  Jeffery Lambert, Jeffery Lambert MR#:  [id number] DATE OF BIRTH:  1921-04-22  PRIMARY CARE PHYSICIAN: Janeann Forehand., MD  PRIMARY CARDIOLOGIST: Marcina Millard, MD  REFERRING EMERGENCY ROOM PHYSICIAN:  Sheryl L. Mindi Junker, MD  CHIEF COMPLAINT: Shortness of breath and cough for 1 week and fall this morning.   HISTORY OF PRESENT ILLNESS: This very pleasant 81 year old man with past medical history of COPD, hypertension, hypothyroidism, and chronic respiratory failure on 3 liters of oxygen at home, presents today after a fall with head injury. He reports that he was doing the dishes and turned to walk away from the sink when he fell forward. He did hit his head and has a small laceration that has been stapled in the Emergency Room. On presentation to the Emergency Room, he was tachycardic with a blood pressure of 90/68. He reports that for the past week he has been coughing, producing a white lumpy sputum. No hemoptysis. He has been congested and wheezing for the past week. No nausea, vomiting, diarrhea. No fevers or chills. He has been on 3 liters of nasal cannula at home and on presentation to the Emergency Room, oxygen saturation is 87% on room air and improved to 100% on 3 liters.   PAST MEDICAL HISTORY: 1.  Chronic respiratory failure with hypoxia on 3 liters nasal cannula at home.  2.  COPD.  3.  Hypothyroidism.  4.  History of atrial fibrillation/atrial flutter with history of SVT. 5.  Eczema.  6.  Anemia of chronic disease  7.  Anxiety.  SOCIAL HISTORY: The patient lives at home by himself. He has a brother who lives in [place] who is his power of attorney. This is Karenann Cai. The patient is an ex-smoker, quit many years ago. He does not drink alcohol. He does use a walker for ambulation.   FAMILY MEDICAL HISTORY: His mother died of pneumonia and his father died of cancer.    REVIEW OF SYSTEMS: CONSTITUTIONAL: Positive for fatigue and weakness.  HEENT: No change in  vision or hearing. No pain in the eyes or ears. No sore throat or difficulty swallowing.  RESPIRATORY: Positive for coughing wheezing, sputum production. No hemoptysis.  CARDIOVASCULAR: No chest pain, orthopnea, or edema. No palpitations.  GASTROINTESTINAL: No nausea, vomiting, diarrhea, or abdominal pain.  GENITOURINARY: No dysuria or frequency.  ENDOCRINE: No easy bruising or bleeding. He does have a history of hypothyroidism due to amiodarone. SKIN: Positive for history of eczema.  MUSCULOSKELETAL: Positive for arthritis. No new pain in the neck, back, shoulders, knees, or hips. No gout.  NEUROLOGIC: Positive for generalized weakness. No CVA, TIA, seizure, headache, or dementia.  PSYCHIATRIC: No anxiety, depression, schizophrenia, or bipolar disorder.   ALLERGIES: PENICILLIN, CODEINE, AND ZITHROMAX.   HOME MEDICATIONS: Note that the medical reconciliation in the Emergency Room is not completed and this should be reviewed once he arrives to the floor.  1.  Spiriva 18 mcg 1 capsule inhaled daily.  2.  Levothyroxine 100 mcg 1 tablet daily.  3.  ICaps 1 tablet twice a day.  4.  Furosemide 20 mg 1 tablet twice a day.  5.  Fluticasone-salmeterol 250 mcg-50 mcg 1 puff twice a day.  6.  Aspirin 81 mg 1 tablet once a day.  7.  Ferrous sulfate 325 mg 1 tablet twice a day.  8.  Albuterol-ipratropium 2.5 mg-0.5 mg in 3 liters inhaled solution inhaled 4 times a day as needed for shortness of breath.   PHYSICAL EXAMINATION: VITAL  SIGNS: Temperature 98 degrees, respirations 23, blood pressure 120/79, oxygenation 98% on 3 liters.  HEENT: Pupils equal, round, and reactive to light. Conjunctivae are clear, extraocular motion intact. Oral mucous membranes are pink and moist, posterior oropharynx is clear. No exudate, edema, or erythema. The patient is edentulous. Trachea is midline. No cervical lymphadenopathy. No thyromegaly.  RESPIRATORY: There are bibasilar crackles, fair air movement, rapid shallow  respirations with lots of coughing during examination.  CARDIOVASCULAR: Irregular, regular. No murmurs, rubs, or gallops. No peripheral edema. Peripheral pulses are 2+.  ABDOMEN: Soft, nontender, nondistended. Bowel sounds are normal. No guarding, no rebound, no hepatosplenomegaly, no mass.  MUSCULOSKELETAL: No joint effusions. Strength is 5/5 throughout.  NEUROLOGIC: Cranial nerves II through XII grossly intact. Strength and sensation intact, tone is normal.  PSYCHIATRIC: The patient alert and oriented x 4 with good insight into his clinical condition.   LABORATORY DATA: Sodium 137, potassium 3.6, chloride 99, bicarbonate 31, BUN 25, creatinine 0.95, glucose 121, BNP is 9786. Troponin 0.05. White blood cells 7.9, hemoglobin 10.5, platelets 163,000, MCV is 94. UA shows 7 white blood cells and 41 red blood cells per high-powered field.   IMAGING:  1.  CT of the head without contrast shows stable age-related cerebral atrophy, ventriculomegaly, and periventricular white matter disease. No acute intracranial findings or acute skull fracture.  2.  Chest x-ray shows hyperinflation with progressive right greater than left bibasilar airspace disease, especially on the left. This is suspicious for developing infection or aspiration, progressive atelectasis is felt to be less likely. Cardiomegaly without signs of congestive heart failure.   ASSESSMENT AND PLAN: 1.  Community-acquired pneumonia: Obtain blood cultures and sputum cultures. Start Levaquin. There is no leukocytosis or fever at this time. He does seem diffusely weak and was hypotensive on presentation with some positive orthostatics.  Will also provide gentle hydration.  2.  Chronic obstructive pulmonary disease: He does not appear to be having an acute chronic obstructive pulmonary disease exacerbation at this time. Oxygenation is stable on 3 liters as it is at home. Continue with nebulizers and antibiotics; no steroids at this time. He has received  125 mg of Solu-Medrol in the Emergency Room. If his respiratory status were to change, I would add steroids.  3.  Hypothyroidism: Check a TSH. His last TSH checked in hospital was greater than 100 and the dose has been adjusted by his primary care physician. Most recently had laboratories drawn last week, but has not heard the results back yet.  4.  Chronic respiratory failure with hypoxia due to chronic obstructive pulmonary disease: This appears stable.  5.  Elevated BNP: This may be due to pneumonia. I do not see any signs of congestive heart failure exacerbation. He does have a 2-D echocardiogram from May of this year showing a normal ejection fraction. I will not repeat this study at this time.  6.  Prophylaxis: The patient is generally ambulatory. We will hold off on pharmacological prophylaxis especially as he has recently had a fall and has a laceration. 7.  No gastrointestinal prophylaxis at this time.   TIME SPENT ON ADMISSION: 45 minutes.   ____________________________ Ena Dawleyatherine P. Clent RidgesWalsh, MD cpw:LT D: 04/14/2014 16:33:57 ET T: 04/14/2014 16:57:25 ET JOB#: [id number]  cc: Santina Evansatherine P. Clent RidgesWalsh, MD, <Dictator> Gale JourneyATHERINE P Lavonya Hoerner MD ELECTRONICALLY SIGNED 04/23/2014 13:19

## 2014-08-11 NOTE — Consult Note (Signed)
PATIENT NAME:  Jeffery Lambert, Jeffery Lambert MR#:  [id number] DATE OF BIRTH:  1922/03/14  DATE OF CONSULTATION:  04/16/2014  REFERRING PHYSICIAN:  Santina Evansatherine P. Clent RidgesWalsh, MD CONSULTING PHYSICIAN:  Dwayne D. Juliann Paresallwood, MD  PRIMARY PHYSICIAN:  Dr Venora MaplesJames Hawkins  CARDIOLOGIST:  Marcina MillardAlexander Paraschos, MD   INDICATION: Shortness of breath.  HISTORY OF PRESENT ILLNESS: The patient is a 81 year old male with past history of COPD, hypertension, hypothyroidism, and chronic respiratory failure on 3 liters at home. Presented after a fall with some minor head injury. The patient reported that he was doing dishes and turned to walk away from the sink and then fell forward.  He did hit his head and a small laceration that has been stapled in the Emergency Room on presentation. The patient was tachycardic with blood pressure 90/68. He reports that the past week he has been coughing, productive of white sputum, no hemoptysis. He has been congested, wheezing for the past week. No fever, chills, or sweats; 3 liters nasal cannula at home. Presented to the Emergency Room with saturations of about 87 on room air and improved 100% on 3 liters.   PAST MEDICAL HISTORY: COPD with chronic hypoxemia, hypothyroidism, atrial fibrillation, history of SVT, eczema, anemia, anxiety.   SOCIAL HISTORY: The patient lives at home by himself. Brother lives in [place], power of attorney. Ex-smoker. No alcohol consumption, uses a walker for ambulation.   FAMILY HISTORY: Pneumonia, cancer.   REVIEW OF SYSTEMS: Denies blackout spells, syncope. No nausea or vomiting. No fever, no chills, no sweats. He has recently had a fall. No weight loss. No weight gain. No hemoptysis, hematemesis. No bright red blood per rectum. No vision change or hearing change. Sputum production with cough. Recently head trauma.  ALLERGIES: PENICILLIN, CODEINE, ZITHROMAX.  HOME MEDICATIONS: Spiriva daily, levothyroxine 100 mcg daily, Lasix 20 mg twice a day, fluticasone 250/50  twice a day, aspirin 81 mg a day, ferrous sulfate 325 twice a day, albuterol.   PHYSICAL EXAMINATION: VITAL SIGNS: Blood pressure 120/80, pulse of 90, respiratory rate of 16, afebrile.  HEENT: Normocephalic, atraumatic. Pupils equal and reactive to light.  NECK: Supple. No significant JVD, bruits, or adenopathy.  LUNGS: Almost clear to auscultation and percussion. No significant wheezing, rhonchi, or rale.  HEART: Regular rate and rhythm. Positive bowel sounds. No rebound, guarding, or tenderness.  EXTREMITIES: Within normal limits. No contractions. SKIN:  Exam normal.   LABORATORY DATA: Sodium 137, potassium 3.6, chloride 99, bicarbonate 31, BUN of 25, creatinine 0.95, glucose of 121.  BNP about 10,000. Troponin 0.05. White count is 7.9, hemoglobin of 10.5, platelet count of 163,000. UA with some white cells.  IMAGING:  CT of the head unremarkable.  Chest x-ray, hyperinflation, left basilar airspace disease.   ASSESSMENT:  1.  Community-acquired pneumonia.  2.  Chronic obstructive pulmonary disease.  3.  Hypothyroidism.  4.  Respiratory failure with hypoxemia. 5.  Elevated BNP. 6.  Hypoxemia.   PLAN: Agree with admit. Rule out for myocardial infarction. Recommend current therapy. Continue COPD therapy, atrial fibrillation control, rate control, antibiotic therapy for possible pneumonia and/or bronchitis. We will recommend inhalers, supplemental oxygen, antibiotics, possible steroid therapy for possible pneumonia. Rate control for atrial fibrillation, heart failure treatment, and continue to follow the patient conservatively medically.    ____________________________ Bobbie Stackwayne D. Juliann Paresallwood, MD ddc:LT D: 04/16/2014 18:04:52 ET T: 04/16/2014 20:44:11 ET JOB#: [id number]  cc: Dwayne D. Juliann Paresallwood, MD, <Dictator> Alwyn PeaWAYNE D CALLWOOD MD ELECTRONICALLY SIGNED 05/05/2014 8:50

## 2014-08-11 NOTE — Consult Note (Signed)
   Present Illness 81 year old man with past medical history of COPD, hypertension, hypothyroidism, and chronic respiratory failure on 3 liters of oxygen at home, presents today after a fall with head injury. Pt was noted to be hypotensive and in svt. He was given adenocard with improvement in his symptoms. Pt is currently in nsr at rate of 77. Pt deneis chest pain or shortness of breath. Prior to presenting to the er, he was doing well and then felt lightheaded followed by a fall. He was in narrow complex tachycardia in the er. He has converted to nsr. He has evidence of pneumonia on cxr.   Physical Exam:  GEN cachectic, thin   NECK No masses   RESP no use of accessory muscles  rhonchi   CARD Regular rate and rhythm  Normal, S1, S2  Murmur   Murmur Systolic   Systolic Murmur axilla   ABD denies tenderness   LYMPH negative neck   EXTR negative cyanosis/clubbing   SKIN normal to palpation   NEURO cranial nerves intact, motor/sensory function intact   PSYCH A+O to time, place, person   Review of Systems:  Subjective/Chief Complaint sob and weakness   General: Weakness   Skin: No Complaints   ENT: No Complaints   Eyes: No Complaints   Neck: No Complaints   Respiratory: Short of breath   Cardiovascular: Palpitations   Gastrointestinal: No Complaints   Genitourinary: No Complaints   Vascular: No Complaints   Musculoskeletal: No Complaints   Neurologic: No Complaints   Hematologic: No Complaints   Endocrine: No Complaints   Psychiatric: No Complaints   Review of Systems: All other systems were reviewed and found to be negative   Medications/Allergies Reviewed Medications/Allergies reviewed   Family & Social History:  Family and Social History:  Family History Non-Contributory   EKG:  Interpretation initially, svt converted to nsr    PCN: Other  Codeine: Other  Zithromax: Unknown   Impression 81 yo male with history of emphysema on home oxygen  who was admitted after suffering a fall at home. Noted to be in svt in the er with hypotension. Converted to nsr. CXR suggested pneumonia with no pulmonary edema. Etiology of svt appears to be secondary to airspace disease followed by relative volume depletion. Currently in nsr at rate of 70-80 and hemodynamically stable. Would continue off of rate related meds for now with consideration for low dose beta blockers if he has recurrent arrythmia. Continue with antibiotics and gentle hydration   Plan 1. Treat pneumonia as you are doing 2. FOllow on telemetry 3. WIll add low dose beta blockers if there is further svt   Electronic Signatures: Dalia HeadingFath, Betzayda Braxton A (MD)  (Signed 04-Jan-16 07:39)  Authored: General Aspect/Present Illness, History and Physical Exam, Review of System, Family & Social History, EKG , Allergies, Impression/Plan   Last Updated: 04-Jan-16 07:39 by Dalia HeadingFath, Vauda Salvucci A (MD)

## 2014-08-11 NOTE — Discharge Summary (Signed)
PATIENT NAME:  Jeffery Lambert, Jeffery Lambert MR#:  [id number] DATE OF BIRTH:  [birth year]  DATE OF ADMISSION:  04/14/2014 DATE OF DISCHARGE:  04/22/2014  PRESENTING COMPLAINT: Shortness of breath.   DISCHARGE DIAGNOSES:  1.  Acute on chronic respiratory failure with significant deterioration in patient's lung condition.  2.  Severe emphysema, end-stage.  3.  Failure to thrive.   CONDITION ON DISCHARGE:  Guarded.   CODE STATUS: No code, DNR.   BRIEF SUMMARY OF HOSPITAL COURSE: Mr. [name] is a 81 year old Caucasian gentleman with history of emphysema, comes in with:  1.  Acute on chronic respiratory failure with hypoxia due to COPD exacerbation and started on IV Solu-Medrol and the patient was placed on nonrebreather. He was having significant amount of cough, was very weak and his symptoms were not improving much. Palliative care consultation was obtained. It was discussed with the family, brother, and sister and they were in agreement for patient to be kept more comfortable and transferred to hospice home for symptom management. The patient received IV antibiotics, which were then discontinued at discharge since patient was able to comfort measures.  2.  Tachycardia secondary to pneumonia.  3.  Community-acquired pneumonia. He remained on antibiotics while in the hospital.  4.  Failure to thrive/malnourishment. The patient was discharged to the hospice home.   TIME SPENT: 40 minutes.    ____________________________ Wylie HailSona A. Allena KatzPatel, MD sap:bm D: 04/23/2014 16:42:19 ET T: 02-21-15 01:29:45 ET JOB#: [id number]  cc: Shaun Runyon A. Allena KatzPatel, MD, <Dictator> Willow OraSONA A Halia Franey MD ELECTRONICALLY SIGNED 04/25/2014 11:21

## 2014-08-11 NOTE — Consult Note (Signed)
   Comments   I met with pt's brother and sister-in-law. Family updated. They have never seen patient in such a state of decline. We talked about option of the Hospice Home and they have talked with patient about this over the weekend. All agree with transfer and focus on comfort. Orders entered. Case discussed with attending.   Electronic Signatures: Elija Mccamish, Kirt Boys (NP)  (Signed 11-Jan-16 09:55)  Authored: Palliative Care   Last Updated: 11-Jan-16 09:55 by Irean Hong (NP)

## 2015-03-04 IMAGING — CR DG CHEST 1V PORT
1 series · 1 of 1 positions shown · non-contrast
Comparison: 09/04/2013

CLINICAL DATA: Weakness following fall

EXAM:
PORTABLE CHEST - 1 VIEW

[ap]
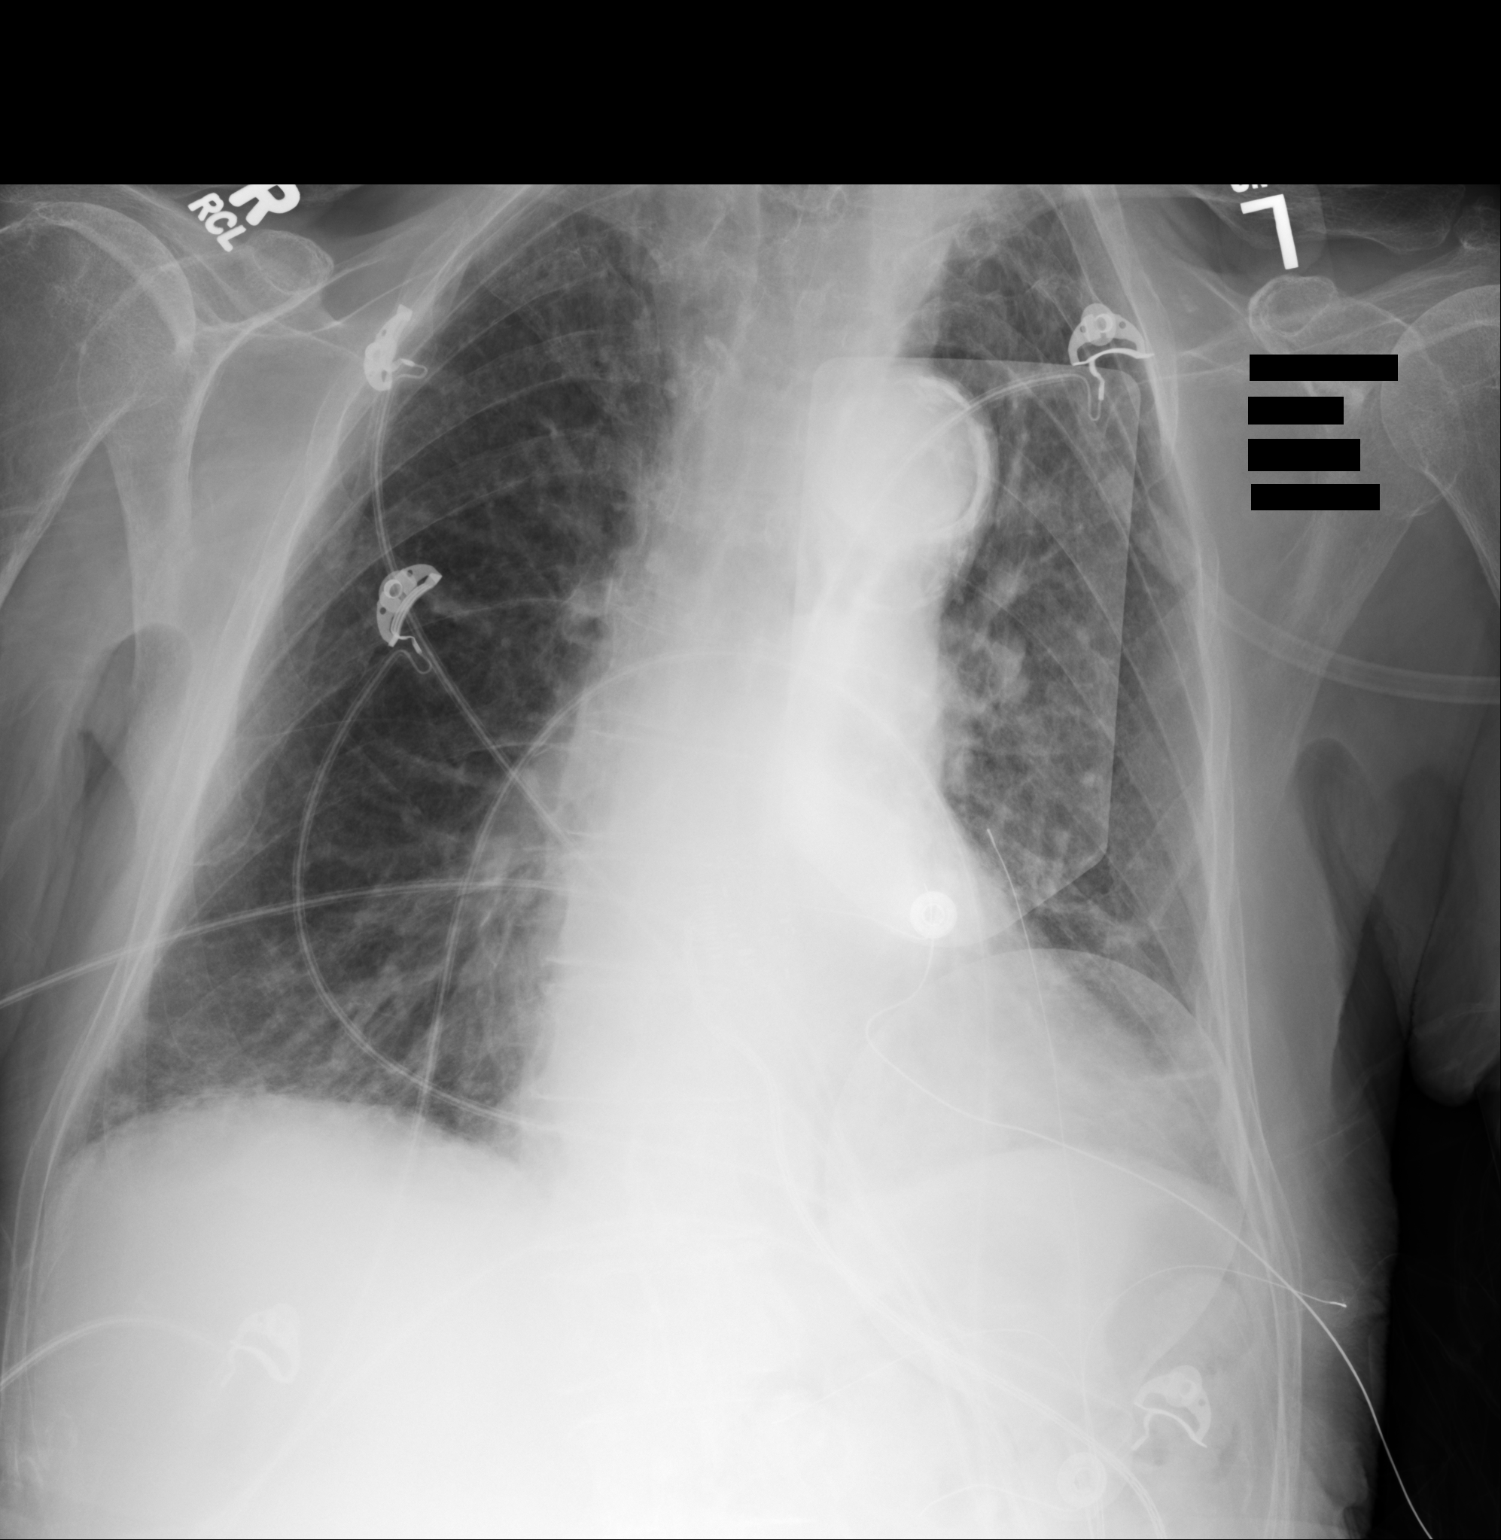

[1 of 1 positions shown; findings below may reference images not displayed]

FINDINGS: Cardiac shadow is stable. Heavy calcification of the thoracic aorta
is again seen and stable. The lungs are well aerated but show mild
bibasilar atelectatic changes. No acute bony abnormality is seen.
IMPRESSION: Mild bibasilar atelectatic changes

## 2015-06-15 IMAGING — CR DG CHEST 1V PORT
1 series · 1 of 1 positions shown · non-contrast
Comparison: 04/14/2014

CLINICAL DATA: Decreased breath sounds.

EXAM:
PORTABLE CHEST - 1 VIEW

[portable]
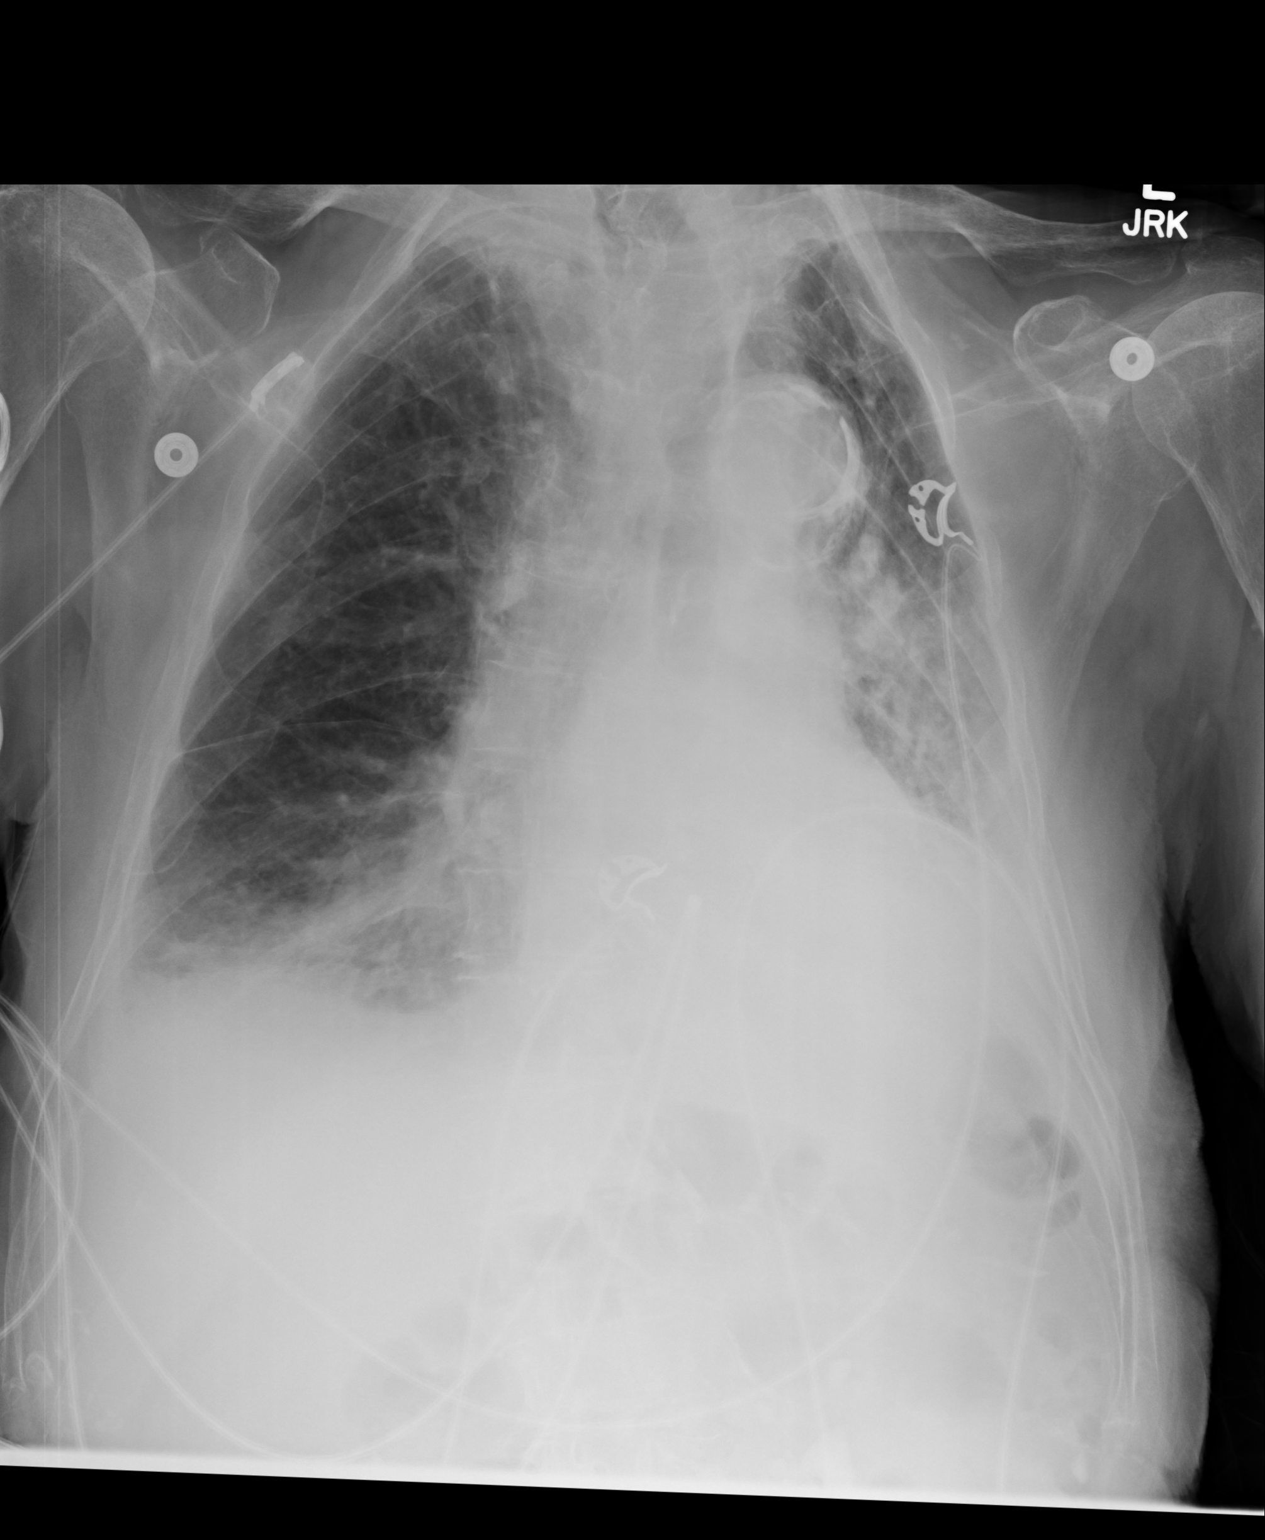

[1 of 1 positions shown; findings below may reference images not displayed]

FINDINGS: Mild cardiac enlargement. Pulmonary vascularity is increasing since
previous study. Developing left and right lung base infiltrates or
atelectasis. Blunting of the costophrenic angles suggest small
bilateral pleural effusions. Fluid or thickened pleura in the right
apex. Calcified and tortuous aorta. No pneumothorax.
IMPRESSION: Cardiac enlargement with developing bilateral pleural effusions and
basilar atelectasis or infiltration. Fluid or thickened pleura in
the right apex.

## 2015-06-18 IMAGING — CR DG CHEST 1V PORT
1 series · 1 of 1 positions shown · non-contrast
Comparison: April 16, 2014.

CLINICAL DATA: Hypoxia, pneumonia.

EXAM:
PORTABLE CHEST - 1 VIEW

[ap]
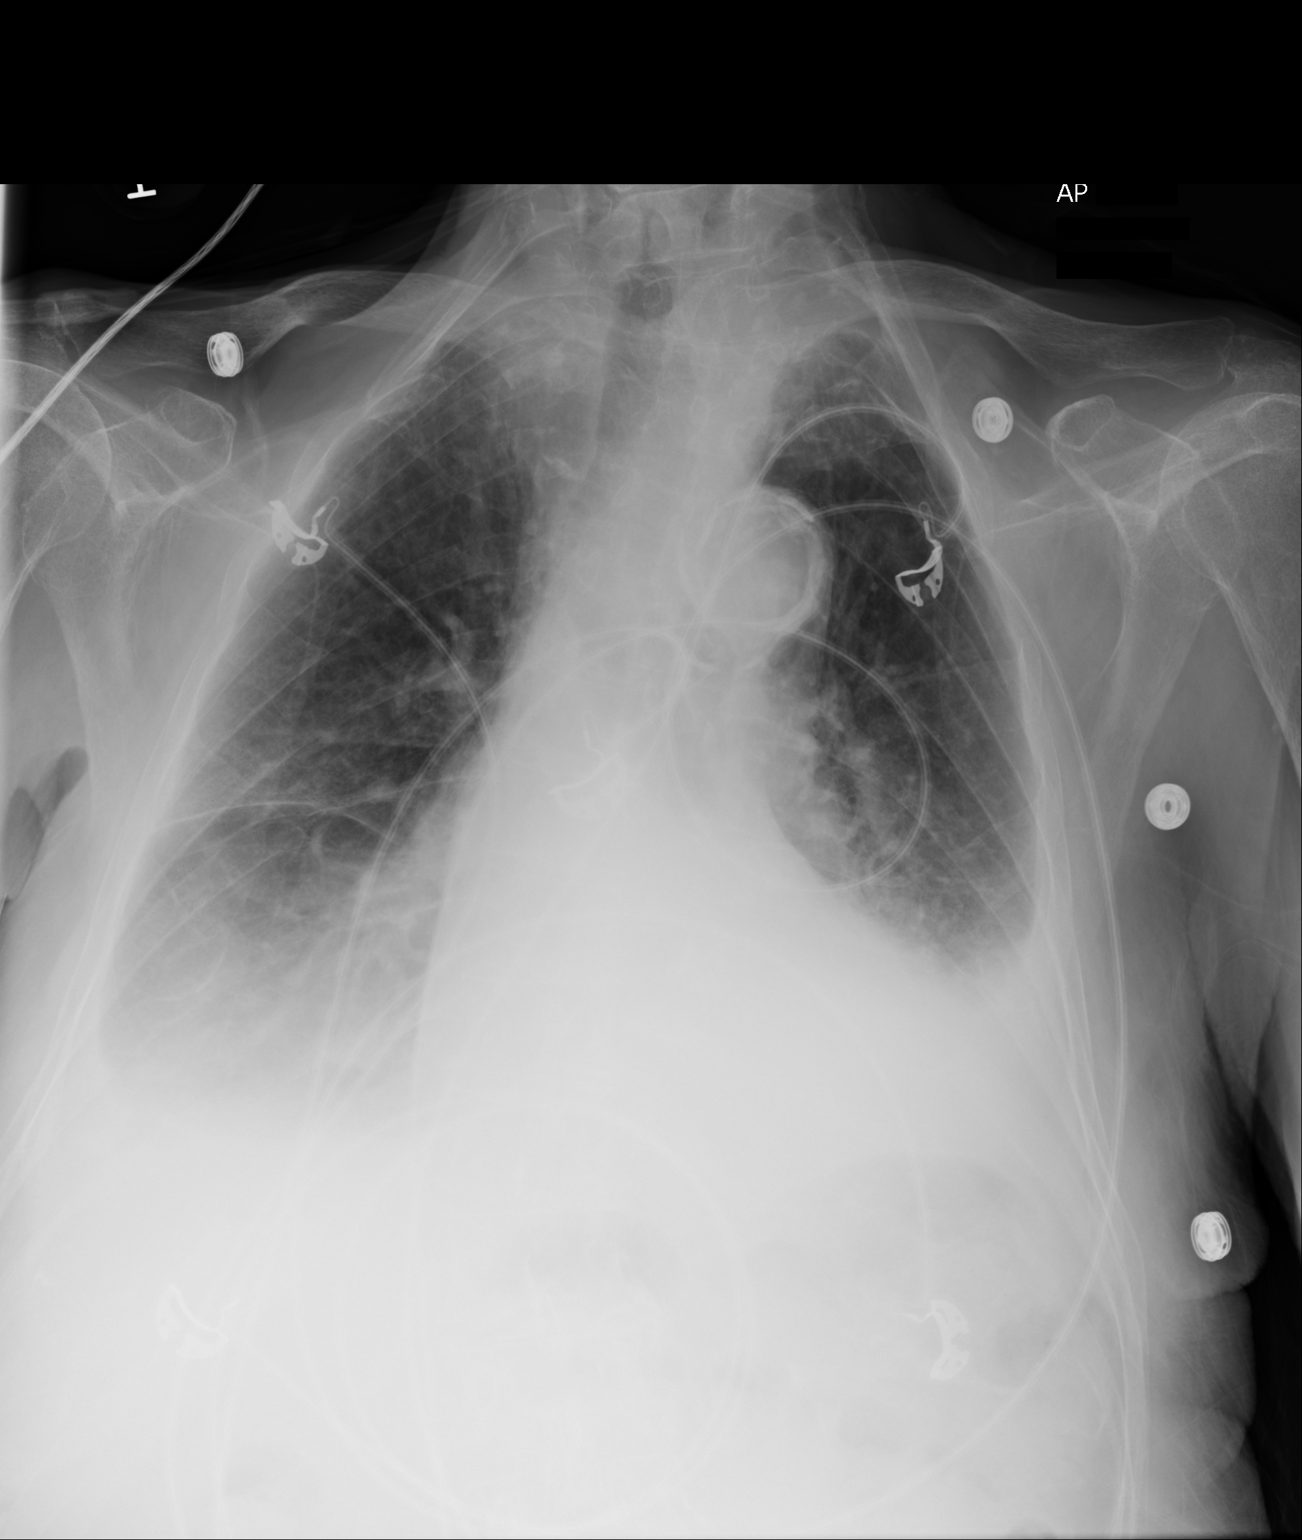

[1 of 1 positions shown; findings below may reference images not displayed]

FINDINGS: Stable cardiomegaly. No pneumothorax is noted. Bilateral pleural
effusions are noted, left greater than right, which are increased
compared to prior exam. Underlying atelectasis cannot be excluded.
Bony thorax appears intact.
IMPRESSION: Increased bilateral pleural effusions compared to prior exam.
